# Patient Record
Sex: Female | Born: 1955 | Race: White | Hispanic: No | Marital: Married | Smoking: Never smoker
Health system: Southern US, Community
[De-identification: ages and names within clinical notes are randomized; demographics above are authoritative.]

## PROBLEM LIST (undated history)

## (undated) DIAGNOSIS — D649 Anemia, unspecified: Secondary | ICD-10-CM

## (undated) DIAGNOSIS — Z789 Other specified health status: Secondary | ICD-10-CM

## (undated) DIAGNOSIS — J189 Pneumonia, unspecified organism: Secondary | ICD-10-CM

## (undated) HISTORY — PX: ANKLE SURGERY: SHX546

## (undated) HISTORY — PX: COLONOSCOPY: SHX174

## (undated) HISTORY — PX: PILONIDAL CYST EXCISION: SHX744

## (undated) HISTORY — PX: DILATION AND CURETTAGE OF UTERUS: SHX78

## (undated) HISTORY — PX: CHOLECYSTECTOMY: SHX55

---

## 1998-08-14 ENCOUNTER — Encounter: Admission: RE | Admit: 1998-08-14 | Discharge: 1998-11-12 | Payer: Self-pay | Admitting: Internal Medicine

## 1999-07-29 ENCOUNTER — Encounter: Admission: RE | Admit: 1999-07-29 | Discharge: 1999-08-23 | Payer: Self-pay | Admitting: *Deleted

## 2002-07-12 ENCOUNTER — Encounter: Payer: Self-pay | Admitting: Emergency Medicine

## 2002-07-12 ENCOUNTER — Emergency Department (HOSPITAL_COMMUNITY): Admission: EM | Admit: 2002-07-12 | Discharge: 2002-07-12 | Payer: Self-pay | Admitting: Emergency Medicine

## 2002-07-15 ENCOUNTER — Encounter: Payer: Self-pay | Admitting: General Surgery

## 2002-07-16 ENCOUNTER — Encounter (INDEPENDENT_AMBULATORY_CARE_PROVIDER_SITE_OTHER): Payer: Self-pay | Admitting: *Deleted

## 2002-07-16 ENCOUNTER — Ambulatory Visit (HOSPITAL_COMMUNITY): Admission: RE | Admit: 2002-07-16 | Discharge: 2002-07-17 | Payer: Self-pay | Admitting: General Surgery

## 2002-07-16 ENCOUNTER — Encounter: Payer: Self-pay | Admitting: General Surgery

## 2003-10-28 ENCOUNTER — Ambulatory Visit (HOSPITAL_BASED_OUTPATIENT_CLINIC_OR_DEPARTMENT_OTHER): Admission: RE | Admit: 2003-10-28 | Discharge: 2003-10-28 | Payer: Self-pay | Admitting: Orthopaedic Surgery

## 2005-02-14 ENCOUNTER — Other Ambulatory Visit: Admission: RE | Admit: 2005-02-14 | Discharge: 2005-02-14 | Payer: Self-pay | Admitting: Family Medicine

## 2007-09-06 ENCOUNTER — Inpatient Hospital Stay (HOSPITAL_COMMUNITY): Admission: AD | Admit: 2007-09-06 | Discharge: 2007-09-06 | Payer: Self-pay | Admitting: Obstetrics and Gynecology

## 2007-09-12 ENCOUNTER — Encounter (INDEPENDENT_AMBULATORY_CARE_PROVIDER_SITE_OTHER): Payer: Self-pay | Admitting: Obstetrics and Gynecology

## 2007-09-12 ENCOUNTER — Ambulatory Visit (HOSPITAL_COMMUNITY): Admission: RE | Admit: 2007-09-12 | Discharge: 2007-09-12 | Payer: Self-pay | Admitting: Obstetrics and Gynecology

## 2011-03-08 NOTE — Op Note (Signed)
Tina Valdez, Tina Valdez                ACCOUNT NO.:  [id number]   MEDICAL RECORD NO.:  [id number]          PATIENT TYPE:  AMB   LOCATION:  SDC                           FACILITY:  [facility]   PHYSICIAN:  Charles A. Delcambre, MDDATE OF BIRTH:  [birth year]   DATE OF PROCEDURE:  09/12/2007  DATE OF DISCHARGE:                               OPERATIVE REPORT   PREOPERATIVE DIAGNOSIS:  Post menopausal bleeding, possible endometrial  polyp seen on endometrial biopsy.   POSTOPERATIVE DIAGNOSIS:  Post menopausal bleeding, no evidence of  polyp.   PROCEDURE:  1. Hysteroscopy.  2. Dilation and curettage.  3. Paracervical block.   SURGEON:  Charles A. Delcambre, MD   ASSISTANT:  None.   COMPLICATIONS:  None.   ESTIMATED BLOOD LOSS:  Less than or equal to 25 mL.   COMPLICATIONS:  None.   SORBITOL LOSS:  50 mL.   OPERATIVE FINDINGS:  Shaggy appearing endometrium inappropriate for post  menopausal status but possibly related to progesterone use in an effort  to stop her bleeding.   SPECIMENS:  Endometrial curettings to pathology.   DESCRIPTION OF PROCEDURE:  The patient was taken to the operating room  and placed in the supine position.  General anesthetic was induced  without difficulty.  She was then placed in the dorsal lithotomy  position in universal stirrups.  A sterile prep and drape was  undertaken.  A speculum was placed in the vagina.  The anterior lip of  the cervix was grasped with a single tooth tenaculum.  A paracervical  block with 10 mL each at 4 and 8 o'clock with 0.25% plain Marcaine was  placed without any evidence of intravascular location of injection.  Hegar dilators could be used to dilate enough to pass the small  hysteroscope.  The hysteroscopic findings are noted above.  The  hysteroscope was removed.  The small banjo curette was inserted.  There  was no evidence of perforation.  Generalized curettings down to a gritty surface were done and a small  amount of  tissue was obtained. There was no evidence of complication.  The tenaculum was removed.  Hemostasis was excellent.  The patient was  awakened and taken to the recovery room with the physician in attendance  having tolerated the procedure well.      Charles A. Sydnee Cabal, MD  Electronically Signed     CAD/MEDQ  D:  09/12/2007  T:  09/12/2007  Job:  [id number]

## 2011-03-11 NOTE — Op Note (Signed)
NAMEJANILLE, DRAUGHON NO.:  [id number]   MEDICAL RECORD NO.:  [id number]                   PATIENT TYPE:  OIB   LOCATION:  5727                                 FACILITY:  [facility]   PHYSICIAN:  Adolph Pollack, M.D.            DATE OF BIRTH:  1956/08/07   DATE OF PROCEDURE:  07/16/2002  DATE OF DISCHARGE:                                 OPERATIVE REPORT   PREOPERATIVE DIAGNOSES:  Symptomatic cholelithiasis.   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   OPERATION PERFORMED:  Laparoscopic cholecystectomy with intraoperative  cholangiogram.   SURGEON:  Adolph Pollack, M.D.   ASSISTANT:  Gabrielle Dare. Janee Morn, M.D.   ANESTHESIA:  General.   INDICATIONS FOR PROCEDURE:  The patient is a 55 year old female who went to  the emergency department approximately one week ago with a severe case of  biliary colic.  Liver function tests, amylase and lipase were normal.  White  blood cell count was slightly elevated at 11,500 and ultrasound demonstrated  a 1.7 cm mobile gallstone near the neck of the gallbladder.  She was seen in  the urgent office where she was feeling some better.  She was scheduled for  cholecystectomy today.  The procedure and the risks were explained to her  preoperatively.   DESCRIPTION OF PROCEDURE:  She was placed supine on the operating table and  a general anesthetic was administered.  Her abdomen was sterilely prepped  and draped.  Local anesthetic was infiltrated in the subumbilical region and  a subumbilical incision was made incising the skin and subcutaneous tissues  sharply.  The fascia was identified and a 1.5 cm incision made in the  midline fascia.  A pursestring suture of 0 Vicryl was placed around the  fascial edges.  The peritoneal cavity was then entered bluntly and under  direct vision.  A Hasson trocar was introduced into the peritoneal cavity  and pneumoperitoneum  created by installation of CO2 gas.  A laparoscope was  then introduced and the patient placed in the appropriate position.  Under  direct vision, an 11 mm trocar was placed through an epigastric incision  with two 5 mm trocars placed through right upper quadrant incisions.  The  liver appeared to be fairly normal.  The gallbladder had adhesions from the  omentum to it and appeared to be chronically inflamed.  The fundus of the  gallbladder was grasped and retracted toward the right shoulder.  The  infundibulum was then grasped and retracted laterally.  Using blunt  dissection and electrocautery the infundibulum was completely mobilized.  I  then identified an anterior branch of the cystic artery, clipped it and  divided it.  The cystic duct was then isolated and a window created around  it.  A clip was placed in the cystic duct gallbladder junction and a small  incision made in the cystic  duct.  A cholangiocatheter was passed through  the anterior abdominal wall and placed into the cystic duct and a  cholangiogram was performed.   Under real time fluoroscopy, dilute contrast was injected into the cystic  duct.  It was a moderately long cystic duct.  The common bile duct and  common hepatic ducts filled rapidly and the common bile duct promptly  emptied contrast into the duodenum without obvious evidence of obstruction.  The right and left hepatic ducts were seen.  Final reports pending a  radiologist's interpretation.  The cholangiocatheter was removed and the  cystic duct clipped three times proximally and divided.  A posterior branch  of the cystic artery was identified clipped and divided.  We then dissected  the gallbladder free from the liver bed using the cautery.  All dissection  was done very close to the gallbladder.  Once the gallbladder was removed it  was placed in an Endopouch bag.  The gallbladder fossa was irrigated and  bleeding points were controlled with the cautery.  It  was inspected a couple  of more times.  No bleeding or bile leakage was noted.  It was then  irrigated and the fluid was evacuated.  The gallbladder was then removed in  the Endopouch bag through the subumbilical port.  The subumbilical fascial  defect was then closed by tightening up and tying the pursestring suture  under laparoscopic vision.  Skin incisions were closed with 4-0 Monocryl  subcuticular sutures after all trocars were removed and the pneumoperitoneum  was released.  Steri-Strips and sterile dressings were then applied to the  incisions.   The patient tolerated the procedure well without any apparent complications  and was taken to the recovery room in satisfactory condition.                                                 Adolph Pollack, M.D.    Kari Baars  D:  07/16/2002  T:  07/17/2002  Job:  [id number]   cc:   [employer].

## 2011-03-11 NOTE — Op Note (Signed)
NAMEDOMONIC, HISCOX                            ACCOUNT NO.:  [id number]   MEDICAL RECORD NO.:  [id number]                   PATIENT TYPE:  AMB   LOCATION:  DSC                                  FACILITY:  [facility]   PHYSICIAN:  Lubertha Basque. Jerl Santos, M.D.             DATE OF BIRTH:  1956-03-26   DATE OF PROCEDURE:  10/28/2003  DATE OF DISCHARGE:                                 OPERATIVE REPORT   PREOPERATIVE DIAGNOSES:  1. Left foot Haglund's deformity.  2. Left tendo-Achilles spur.   POSTOPERATIVE DIAGNOSES:  1. Left foot Haglund's deformity.  2. Left tendo-Achilles spur.   OPERATION PERFORMED:  1. Left foot pump bump excision.  2. Left foot tendo-Achilles debridement.   SURGEON:  Lubertha Basque. Jerl Santos, M.D.   ASSISTANT:  Prince Rome, P.A.   ANESTHESIA:  General.   INDICATIONS FOR PROCEDURE:  The patient is a 55 year old woman with years of  foot pain worse on the left.  This has persisted despite oral anti-  inflammatories, orthotics, and a heel lift.  She continues with difficulty  about the back of her heel, both in the area of her retrocalcaneal bursa and  at the Achilles attachment site.  She has a prominent Haglund's deformity on  x-ray and a small insertional spur of the Achilles.  She was offered  excision of the pump bump and debridement of her Achilles with removal of  the spur.  Informed operative consent was obtained after discussion of  possible complications of reaction to anesthesia, infection, and Achilles  rupture.   DESCRIPTION OF PROCEDURE:  The patient was taken to the operating suite  where general anesthetic was applied without difficulty.  The patient was  positioned prone with chest rolls.  All bony prominences were appropriately  padded.  She was prepped and draped in the normal sterile fashion.  After  administration of preop intravenous antibiotics, the left leg was elevated,  exsanguinated and tourniquet inflated about the calf.  A small  posteromedial  longitudinal incision was made with dissection down to the retrocalcaneal  bursa.  The Achilles was retracted out of harm's way and fluoroscopy was  used to confirm location of the pump bump.  This was removed with an  oscillating saw under fluoroscopic guidance.  I read these views myself.  I  also excised the retrocalcaneal bursa.  She did have a small spur in her  Achilles tendon.  A longitudinal split was made in the structure and the  spur was excised.  I debrided the peritenon which did appear to be fairly  inflamed.  The longitudinal split in her tendon was repaired with 2-0 Vicryl  in interrupted fashion.  The wounds were thoroughly irrigated followed by  release of the tourniquet.  A small amount of bleeding was easily controlled  with Bovie cautery.  Subcutaneous tissues were reapproximated with 2-0  undyed Vicryl and skin was closed with  nylon.  Marcaine was injected about  the incision site in the subcutaneous tissues followed by Adaptic and a dry  gauze dressing with a posterior splint of plaster with the ankle in neutral  position.  Estimated blood loss and intraoperative fluids as well as  accurate tourniquet time can be obtained from anesthesia records.   DISPOSITION:  The patient was extubated in the operating room and taken to  the recovery room in stable condition.  Plans were for the patient to go  home the same day and to follow up in the office in less than a week.  I  will contact her by phone tonight.                                               Lubertha Basque Jerl Santos, M.D.    PGD/MEDQ  D:  10/28/2003  T:  10/28/2003  Job:  [id number]

## 2011-08-02 LAB — CBC
MCV: 86.1
Platelets: 467 — ABNORMAL HIGH
RDW: 14.5
WBC: 12 — ABNORMAL HIGH

## 2011-08-02 LAB — SAMPLE TO BLOOD BANK

## 2015-08-07 ENCOUNTER — Other Ambulatory Visit: Payer: Self-pay | Admitting: Family Medicine

## 2015-08-07 DIAGNOSIS — N939 Abnormal uterine and vaginal bleeding, unspecified: Secondary | ICD-10-CM

## 2015-08-10 ENCOUNTER — Ambulatory Visit
Admission: RE | Admit: 2015-08-10 | Discharge: 2015-08-10 | Disposition: A | Discharge status: Home / Self Care | Payer: No Typology Code available for payment source | Source: Ambulatory Visit | Attending: Family Medicine | Admitting: Family Medicine

## 2015-08-10 DIAGNOSIS — N939 Abnormal uterine and vaginal bleeding, unspecified: Secondary | ICD-10-CM

## 2015-08-18 ENCOUNTER — Other Ambulatory Visit: Payer: Self-pay | Admitting: Obstetrics & Gynecology

## 2015-09-02 ENCOUNTER — Inpatient Hospital Stay (HOSPITAL_COMMUNITY)
Admission: AD | Admit: 2015-09-02 | Discharge: 2015-09-02 | Disposition: A | Discharge status: Home / Self Care | Payer: BLUE CROSS/BLUE SHIELD | Source: Ambulatory Visit | Attending: Obstetrics & Gynecology | Admitting: Obstetrics & Gynecology

## 2015-09-02 ENCOUNTER — Encounter (HOSPITAL_COMMUNITY): Payer: Self-pay | Admitting: *Deleted

## 2015-09-02 ENCOUNTER — Inpatient Hospital Stay (HOSPITAL_COMMUNITY): Payer: BLUE CROSS/BLUE SHIELD

## 2015-09-02 DIAGNOSIS — N939 Abnormal uterine and vaginal bleeding, unspecified: Secondary | ICD-10-CM | POA: Insufficient documentation

## 2015-09-02 HISTORY — DX: Other specified health status: Z78.9

## 2015-09-02 LAB — URINALYSIS, ROUTINE W REFLEX MICROSCOPIC
BILIRUBIN URINE: NEGATIVE
GLUCOSE, UA: 250 mg/dL — AB
KETONES UR: 15 mg/dL — AB
Nitrite: POSITIVE — AB
Specific Gravity, Urine: <1.005 — ABNORMAL LOW (ref 1.005–1.030)
Urobilinogen, UA: 2 mg/dL — ABNORMAL HIGH (ref 0.0–1.0)
pH: 7 (ref 5.0–8.0)

## 2015-09-02 LAB — URINE MICROSCOPIC-ADD ON

## 2015-09-02 LAB — CBC
HEMATOCRIT: 42.4 % (ref 36.0–46.0)
Hemoglobin: 14.1 g/dL (ref 12.0–15.0)
MCH: 28.9 pg (ref 26.0–34.0)
MCHC: 33.3 g/dL (ref 30.0–36.0)
MCV: 86.9 fL (ref 78.0–100.0)
PLATELETS: 398 10*3/uL (ref 150–400)
RBC: 4.88 MIL/uL (ref 3.87–5.11)
RDW: 14.9 % (ref 11.5–15.5)
WBC: 7.6 10*3/uL (ref 4.0–10.5)

## 2015-09-02 NOTE — Discharge Instructions (Signed)

## 2015-09-02 NOTE — MAU Provider Note (Signed)
Chief Complaint: Vaginal Bleeding and Abdominal Pain   First Provider Initiated Contact with Patient 09/02/15 1149      SUBJECTIVE HPI: Tina Valdez is a 59 y.o. G2P1101 who presents to maternity admissions reporting onset of heavy vaginal bleeding 5 days ago with history of AUB controlled on Provera 10 gm daily.  She reports bleeding had stopped, and she was not requiring pads, then on 11/4 she had episode of bleeding causing a large puddle underneath her in bed, followed by 5 days of heavy menstrual bleeding with large clots, requiring frequent pad changes.  She reports some mild dizziness occasionally since onset of bleeding.  She is taking Provera 10 mg daily as prescribed. She denies vaginal itching/burning, urinary symptoms, h/a, dizziness, n/v, or fever/chills.     Vaginal Bleeding The patient's primary symptoms include vaginal bleeding. The patient's pertinent negatives include no pelvic pain or vaginal discharge. This is a recurrent problem. The current episode started today. The problem occurs constantly. The problem has been unchanged. The patient is experiencing no pain. Pertinent negatives include no abdominal pain, back pain, chills, constipation, diarrhea, dysuria, fever, flank pain, frequency, headaches, nausea or vomiting. The vaginal bleeding is heavier than menses. She has been passing clots. She has not been passing tissue. Nothing aggravates the symptoms. She has tried NSAIDs for the symptoms. The treatment provided no relief. Her past medical history is significant for menorrhagia and metrorrhagia.    Past Medical History  Diagnosis Date  . Medical history non-contributory    Past Surgical History  Procedure Laterality Date  . Cesarean section    . Pilonidal cyst excision    . Dilation and curettage of uterus    . Cholecystectomy    . Ankle surgery    . Carpal tunnel release      Left wrist   Social History   Social History  . Marital Status: Married    Spouse  Name: N/A  . Number of Children: N/A  . Years of Education: N/A   Occupational History  . Not on file.   Social History Main Topics  . Smoking status: Never Smoker   . Smokeless tobacco: Not on file  . Alcohol Use: No  . Drug Use: No  . Sexual Activity: Yes    Birth Control/ Protection: Post-menopausal   Other Topics Concern  . Not on file   Social History Narrative  . No narrative on file   No current facility-administered medications on file prior to encounter.   No current outpatient prescriptions on file prior to encounter.   Allergies  Allergen Reactions  . Bee Venom Swelling    ROS:  Review of Systems  Constitutional: Negative for fever, chills and fatigue.  HENT: Negative for sinus pressure.   Eyes: Negative for photophobia.  Respiratory: Negative for shortness of breath.   Cardiovascular: Negative for chest pain.  Gastrointestinal: Negative for nausea, vomiting, abdominal pain, diarrhea and constipation.  Genitourinary: Positive for vaginal bleeding and menorrhagia. Negative for dysuria, frequency, flank pain, vaginal discharge, difficulty urinating, vaginal pain and pelvic pain.  Musculoskeletal: Negative for back pain and neck pain.  Neurological: Negative for dizziness, weakness and headaches.  Psychiatric/Behavioral: Negative.      I have reviewed patient's Past Medical Hx, Surgical Hx, Family Hx, Social Hx, medications and allergies.   Physical Exam   Patient Vitals for the past 24 hrs:  BP Temp Temp src Pulse Resp SpO2  09/02/15 1422 152/83 mmHg 98.5 F (36.9 C) Oral 73 20 -  09/02/15 1121 134/57 mmHg 98.3 F (36.8 C) Oral 75 20 98 %   Constitutional: Well-developed, well-nourished female in no acute distress.  Cardiovascular: normal rate Respiratory: normal effort GI: Abd soft, non-tender. Pos BS x 4 MS: Extremities nontender, no edema, normal ROM Neurologic: Alert and oriented x 4.  GU: Neg CVAT.  PELVIC EXAM: Cervix pink, visually  closed, without lesion, small to moderate amount of blood in vault, no active bleeding from os during exam, vaginal walls and external genitalia normal Bimanual exam: Cervix 0/long/high, firm, anterior, neg CMT, uterus nontender, nonenlarged, adnexa without tenderness, enlargement, or mass, exam technically difficult due to body habitus   LAB RESULTS Results for orders placed or performed during the hospital encounter of 09/02/15 (from the past 24 hour(s))  Urinalysis, Routine w reflex microscopic (not at [facility])     Status: Abnormal   Collection Time: 09/02/15 11:00 AM  Result Value Ref Range   Color, Urine RED (A) YELLOW   APPearance CLOUDY (A) CLEAR   Specific Gravity, Urine <1.005 (L) 1.005 - 1.030   pH 7.0 5.0 - 8.0   Glucose, UA 250 (A) NEGATIVE mg/dL   Hgb urine dipstick LARGE (A) NEGATIVE   Bilirubin Urine NEGATIVE NEGATIVE   Ketones, ur 15 (A) NEGATIVE mg/dL   Protein, ur >469 (A) NEGATIVE mg/dL   Urobilinogen, UA 2.0 (H) 0.0 - 1.0 mg/dL   Nitrite POSITIVE (A) NEGATIVE   Leukocytes, UA MODERATE (A) NEGATIVE  Urine microscopic-add on     Status: Abnormal   Collection Time: 09/02/15 11:00 AM  Result Value Ref Range   Squamous Epithelial / LPF RARE RARE   WBC, UA 0-2 <3 WBC/hpf   RBC / HPF TOO NUMEROUS TO COUNT <3 RBC/hpf   Bacteria, UA FEW (A) RARE  CBC     Status: None   Collection Time: 09/02/15 11:30 AM  Result Value Ref Range   WBC 7.6 4.0 - 10.5 K/uL   RBC 4.88 3.87 - 5.11 MIL/uL   Hemoglobin 14.1 12.0 - 15.0 g/dL   HCT 62.9 52.8 - 41.3 %   MCV 86.9 78.0 - 100.0 fL   MCH 28.9 26.0 - 34.0 pg   MCHC 33.3 30.0 - 36.0 g/dL   RDW 24.4 01.0 - 27.2 %   Platelets 398 150 - 400 K/uL       IMAGING US Transvaginal Non-ob  08/10/2015  CLINICAL DATA:  Six week history of dysfunctional uterine bleeding. EXAM: TRANSABDOMINAL AND TRANSVAGINAL ULTRASOUND OF PELVIS TECHNIQUE: Both transabdominal and transvaginal ultrasound examinations of the pelvis were performed.  Transabdominal technique was performed for global imaging of the pelvis including uterus, ovaries, adnexal regions, and pelvic cul-de-sac. It was necessary to proceed with endovaginal exam following the transabdominal exam to visualize the ovaries and endometrium. COMPARISON:  09/06/2007 FINDINGS: Uterus Measurements: 9.6 x 6.0 x 4.5 cm. Heterogeneous appearance of the myometrium with 2 small cystic areas. Findings could be due to adenomyosis. Suspected 2.8 x 2.1 x 2.5 cm submucosal fibroid involving the anterior myometrium. Endometrium Thickness: 4.5 mm.  No focal abnormality visualized. Right ovary Not visualized.  No adnexal mass. Left ovary Not visualized.  No adnexal mass. Other findings No free fluid. IMPRESSION: 1. Heterogeneous appearance of the myometrium with 2 small cystic areas noted. Findings could be due to adenomyosis. Pelvic MR may be helpful for further evaluation. 2. Suspect a 2.8 x 2.1 x 2.5 cm submucosal fibroid involving the anterior myometrium. 3. Non visualization of both ovaries.  No adnexal masses. Electronically Signed  By: Rudie Meyer M.D.   On: 08/10/2015 14:28   US Pelvis Complete  08/10/2015  CLINICAL DATA:  Six week history of dysfunctional uterine bleeding. EXAM: TRANSABDOMINAL AND TRANSVAGINAL ULTRASOUND OF PELVIS TECHNIQUE: Both transabdominal and transvaginal ultrasound examinations of the pelvis were performed. Transabdominal technique was performed for global imaging of the pelvis including uterus, ovaries, adnexal regions, and pelvic cul-de-sac. It was necessary to proceed with endovaginal exam following the transabdominal exam to visualize the ovaries and endometrium. COMPARISON:  09/06/2007 FINDINGS: Uterus Measurements: 9.6 x 6.0 x 4.5 cm. Heterogeneous appearance of the myometrium with 2 small cystic areas. Findings could be due to adenomyosis. Suspected 2.8 x 2.1 x 2.5 cm submucosal fibroid involving the anterior myometrium. Endometrium Thickness: 4.5 mm.  No focal  abnormality visualized. Right ovary Not visualized.  No adnexal mass. Left ovary Not visualized.  No adnexal mass. Other findings No free fluid. IMPRESSION: 1. Heterogeneous appearance of the myometrium with 2 small cystic areas noted. Findings could be due to adenomyosis. Pelvic MR may be helpful for further evaluation. 2. Suspect a 2.8 x 2.1 x 2.5 cm submucosal fibroid involving the anterior myometrium. 3. Non visualization of both ovaries.  No adnexal masses. Electronically Signed   By: Rudie Meyer M.D.   On: 08/10/2015 14:28    MAU Management/MDM: Ordered labs and reviewed results.  Consult Dr Richardson Dopp to review assessment and labs.  Hemoglobin stable and minimal to moderate bleeding on pelvic exam. Pt denies dysuria, frequency, or suprapubic pain.  Pt to continue Provera daily, follow up outpatient next week.  Pt stable at time of discharge.  ASSESSMENT 1. Abnormal uterine bleeding (AUB)   2. Abnormal uterine bleeding (AUB)     PLAN Discharge home Urine sent for culture Provera 10 mg daily     Medication List    TAKE these medications        ibuprofen 800 MG tablet  Commonly known as:  ADVIL,MOTRIN  Take 800 mg by mouth 3 (three) times daily.     medroxyPROGESTERone 10 MG tablet  Commonly known as:  PROVERA  Take 10 mg by mouth daily.       Follow-up Information    Follow up with Myna Hidalgo, M, DO In 1 week.   Specialty:  Obstetrics and Gynecology   Why:  Return to MAU as needed for emergencies   Contact information:   [address] [place] [address] [phone]       Sharen Counter Certified Nurse-Midwife 09/02/2015  7:37 PM

## 2015-09-02 NOTE — MAU Note (Signed)
Pt states she woke up on 08-28-15 in a "puddle of blood".  Has been bleeding heavy since.  Last two days she has been changing her pad every 30 minutes and passing lots of clots.  Has seen Dr. Charlotta Newtonzan for this same issue and is taking progest. Pills.  Pt states she is very nauseated.

## 2015-10-09 ENCOUNTER — Encounter (HOSPITAL_COMMUNITY): Payer: Self-pay

## 2015-10-09 ENCOUNTER — Encounter (HOSPITAL_COMMUNITY)
Admission: RE | Admit: 2015-10-09 | Discharge: 2015-10-09 | Disposition: A | Discharge status: Home / Self Care | Payer: BLUE CROSS/BLUE SHIELD | Source: Ambulatory Visit | Attending: Obstetrics & Gynecology | Admitting: Obstetrics & Gynecology

## 2015-10-09 DIAGNOSIS — N950 Postmenopausal bleeding: Secondary | ICD-10-CM | POA: Insufficient documentation

## 2015-10-09 DIAGNOSIS — Z01812 Encounter for preprocedural laboratory examination: Secondary | ICD-10-CM | POA: Insufficient documentation

## 2015-10-09 LAB — COMPREHENSIVE METABOLIC PANEL
ALBUMIN: 4.1 g/dL (ref 3.5–5.0)
ALK PHOS: 57 U/L (ref 38–126)
ALT: 25 U/L (ref 14–54)
ANION GAP: 9 (ref 5–15)
AST: 22 U/L (ref 15–41)
BUN: 13 mg/dL (ref 6–20)
CALCIUM: 9.5 mg/dL (ref 8.9–10.3)
CO2: 25 mmol/L (ref 22–32)
Chloride: 107 mmol/L (ref 101–111)
Creatinine, Ser: 0.83 mg/dL (ref 0.44–1.00)
GFR calc Af Amer: >60 mL/min (ref >60)
GFR calc non Af Amer: >60 mL/min (ref >60)
GLUCOSE: 108 mg/dL — AB (ref 65–99)
Potassium: 4.7 mmol/L (ref 3.5–5.1)
SODIUM: 141 mmol/L (ref 135–145)
Total Bilirubin: 0.6 mg/dL (ref 0.3–1.2)
Total Protein: 8.5 g/dL — ABNORMAL HIGH (ref 6.5–8.1)

## 2015-10-09 LAB — CBC
HCT: 41.8 % (ref 36.0–46.0)
HEMOGLOBIN: 13.7 g/dL (ref 12.0–15.0)
MCH: 28.5 pg (ref 26.0–34.0)
MCHC: 32.8 g/dL (ref 30.0–36.0)
MCV: 87.1 fL (ref 78.0–100.0)
Platelets: 419 10*3/uL — ABNORMAL HIGH (ref 150–400)
RBC: 4.8 MIL/uL (ref 3.87–5.11)
RDW: 15.4 % (ref 11.5–15.5)
WBC: 8.5 10*3/uL (ref 4.0–10.5)

## 2015-10-09 NOTE — Patient Instructions (Signed)
   Your procedure is scheduled on: December 28 [facility](WEDNESDAY)  [facility]: 930[facility]  Pick up the phone at the desk and dial [phone] and inform us of your arrival.  Please call this number if you have any problems the morning of surgery: [phone]  DO NOT EAT OR DRINK AFTER MIDNIGHT December 27 (TUESDAY)  Take these medicines the morning of surgery with a SIP OF WATER:  Do not wear jewelry, make-up, or FINGER nail polish No metal in your hair or on your body. Do not wear lotions, powders, perfumes.  You may wear deodorant.  Do not bring valuables to the hospital. Contacts, dentures or bridgework may not be worn into surgery.  Leave suitcase in the car. After Surgery it may be brought to your room. For patients being admitted to the hospital, checkout time is 11:00am the day of discharge.

## 2015-10-16 NOTE — H&P (Signed)
59yo postmenopaual female who presents for laparoscopic hysterectomy, bilateral salpingectomy, possible open. In review, the patient has been followed for signficant postmenopausal bleeding. Pt reports that starting about two months ago she noted bright red bleeding. The bleeding is irregular and unpredictable. Initially it started for a day or two and then it got progressively worse. With increased frequency, amount and passage of grape-sized clots. At it's worst, the bleeding will cause her to change a pad every hour. With significant bleeding she notes pelvic cramping. She has been on progesterone and progesterone to manage her bleeding though it has not completely resolved. At this point, she wishes to proceed with surgical management. US: 9.6x6x4.5cm uterus, 4.685mm endometrial stripe. 2cm submucosal fibroid. Heterogenous appearing myometrium with two areas of cystic changes. Ovaries not visualized. EMB: Highly fragmented benign endometrial tissue.   Current Medications  Taking   Turmeric 500 MG Capsule   Ibuprofen   Provera(Medroxyprogesterone) 10 MG Tablet 1 tablet Once a day   Medication List reviewed and reconciled with the patient    Past Medical History  Osteoarthritis  Mood disorder Obesity   Surgical History  cholecystectomy 2001  left ankle surgery for bone spur 2004  left carpel tunnel repair per Dr. Lilla ShookMeyedierks 06/04/2008  D&C 1985   Family History  Father: unknown  Mother: unknown  Paternal Grand Father: unknown  Paternal Grand Mother: unknown  Maternal Grand Father: unknown  Maternal Grand Mother: unknown  Brother 1: alive  Sister 1: alive  pt is adopted.   Social History  General:  Tobacco use  cigarettes: Former smoker Quit in year 2003 Tobacco history last updated 08/18/2015 no EXPOSURE TO PASSIVE SMOKE.  no Alcohol.  Caffeine: yes, 1-2 servings daily of coffee or soda.  no Recreational drug use.  DIET: drinks water.  no Exercise, walks 2-3 times a week.   Marital Status: Married.  Children: 1, girl and one deceased child.  EDUCATION: High School.  OCCUPATION: unemployed.  Seat belt use: yes.    Gyn History  Sexual activity currently sexually active.  Periods : postmenopausal.  LMP 15 years ago.  Last pap smear date 08/03/2015 Normal.  Last mammogram date 02/01/2015.  menopause early 4740's.    OB History  Pregnancy # 1 live birth, vaginal delivery @ 30 week.  Pregnancy # 2 live birth, C-section delivery, 40 weeks.    Allergies  Bee stings: anaphylaxis   Hospitalization/Major Diagnostic Procedure  cholescystectomy 2001  childbirth x 2 1982/85   Review of Systems  CONSTITUTIONAL:  no Chills. no Fever. no Night sweats.  HEENT:  Blurrred vision no. no Double vision.  CARDIOLOGY:  no Chest pain.  RESPIRATORY:  no Shortness of breath. no Cough.  UROLOGY:  no Urinary frequency. no Urinary incontinence. no Urinary urgency.  GASTROENTEROLOGY:  no Abdominal pain. no Appetite change. no Change in bowel movements.  FEMALE REPRODUCTIVE:  no Breast lumps or discharge. no Breast pain. no Vaginal irritation. no Vaginal itching. no Vaginal spotting.  NEUROLOGY:  no Dizziness. no Headache. no Loss of consciousness.  SKIN:  no Rash. no Hives.  HEMATOLOGY/LYMPH:  no Anemia. no Fatigue. Using Blood Thinners no.     O: Examination performed in office on 12/16 General Examination: GENERAL APPEARANCE well developed, well nourished.  SKIN: warm and dry, no rashes.  NECK: supple, normal appearance.  LUNGS: regular breathing rate and effort.  HEART: no murmurs, regular rate and rhythm- no tachycardia noted.  ABDOMEN: morbidly obese, soft, no rebound, no guarding, unable to palpate for masses.  MUSCULOSKELETAL no calf tenderness bilaterally.  EXTREMITIES: no edema present.  PSYCH appropriate mood and affect.   A/P: 59yo postmenopausal female who present for total laparoscopic hysterectomy, bilateral salpingectomy, possible open  hysterectomy -NPO -LR @ 125cc/hr -Ancef 3g IV -SCDs to OR -Risk/benefits/indications and alternatives were reviewed with patient- including risk of bleeding, infection and injury to surrounding organs.  Questions and concerns were addressed and pt wishes to proceed with procedure.  Myna Hidalgo, DO [phone] (pager) [phone] (office)

## 2015-10-20 MED ORDER — DEXTROSE 5 % IV SOLN
3.0000 g | INTRAVENOUS | Status: AC
  Administered 2015-10-21: 3 g via INTRAVENOUS
  Filled 2015-10-20: qty 3000

## 2015-10-21 ENCOUNTER — Encounter (HOSPITAL_COMMUNITY): Payer: Self-pay | Admitting: Anesthesiology

## 2015-10-21 ENCOUNTER — Ambulatory Visit (HOSPITAL_COMMUNITY): Payer: BLUE CROSS/BLUE SHIELD | Admitting: Anesthesiology

## 2015-10-21 ENCOUNTER — Encounter (HOSPITAL_COMMUNITY)
Admission: RE | Disposition: A | Discharge status: Home / Self Care | Payer: Self-pay | Source: Ambulatory Visit | Attending: Obstetrics & Gynecology

## 2015-10-21 ENCOUNTER — Observation Stay (HOSPITAL_COMMUNITY)
Admission: RE | Admit: 2015-10-21 | Discharge: 2015-10-22 | Disposition: A | Discharge status: Home / Self Care | Payer: BLUE CROSS/BLUE SHIELD | Source: Ambulatory Visit | Attending: Obstetrics & Gynecology | Admitting: Obstetrics & Gynecology

## 2015-10-21 DIAGNOSIS — Z87891 Personal history of nicotine dependence: Secondary | ICD-10-CM | POA: Insufficient documentation

## 2015-10-21 DIAGNOSIS — Z23 Encounter for immunization: Secondary | ICD-10-CM | POA: Insufficient documentation

## 2015-10-21 DIAGNOSIS — N950 Postmenopausal bleeding: Principal | ICD-10-CM | POA: Diagnosis present

## 2015-10-21 DIAGNOSIS — Z6841 Body Mass Index (BMI) 40.0 and over, adult: Secondary | ICD-10-CM | POA: Insufficient documentation

## 2015-10-21 HISTORY — PX: BILATERAL SALPINGECTOMY: SHX5743

## 2015-10-21 HISTORY — PX: LAPAROSCOPIC VAGINAL HYSTERECTOMY WITH SALPINGECTOMY: SHX6680

## 2015-10-21 SURGERY — HYSTERECTOMY, VAGINAL, LAPAROSCOPY-ASSISTED, WITH SALPINGECTOMY
Anesthesia: General | Site: Vagina | Laterality: Bilateral

## 2015-10-21 MED ORDER — METOCLOPRAMIDE HCL 5 MG/ML IJ SOLN: 10.0000 mg | Freq: Once | INTRAMUSCULAR | Status: DC | PRN

## 2015-10-21 MED ORDER — HYDROMORPHONE HCL 1 MG/ML IJ SOLN
0.2500 mg | INTRAMUSCULAR | Status: DC | PRN
  Administered 2015-10-21 (×2): 0.5 mg via INTRAVENOUS

## 2015-10-21 MED ORDER — GLYCOPYRROLATE 0.2 MG/ML IJ SOLN
INTRAMUSCULAR | Status: AC
  Filled 2015-10-21: qty 1

## 2015-10-21 MED ORDER — PROPOFOL 10 MG/ML IV BOLUS
INTRAVENOUS | Status: DC | PRN
  Administered 2015-10-21: 200 mg via INTRAVENOUS

## 2015-10-21 MED ORDER — LACTATED RINGERS IV SOLN
INTRAVENOUS | Status: DC
  Administered 2015-10-21 (×4): via INTRAVENOUS

## 2015-10-21 MED ORDER — PROPOFOL 10 MG/ML IV BOLUS
INTRAVENOUS | Status: AC
  Filled 2015-10-21: qty 20

## 2015-10-21 MED ORDER — KETOROLAC TROMETHAMINE 30 MG/ML IJ SOLN: 30.0000 mg | Freq: Four times a day (QID) | INTRAMUSCULAR | Status: DC

## 2015-10-21 MED ORDER — MEPERIDINE HCL 25 MG/ML IJ SOLN: 6.2500 mg | INTRAMUSCULAR | Status: DC | PRN

## 2015-10-21 MED ORDER — NEOSTIGMINE METHYLSULFATE 10 MG/10ML IV SOLN
INTRAVENOUS | Status: AC
  Filled 2015-10-21: qty 1

## 2015-10-21 MED ORDER — DEXAMETHASONE SODIUM PHOSPHATE 4 MG/ML IJ SOLN
INTRAMUSCULAR | Status: DC | PRN
  Administered 2015-10-21: 4 mg via INTRAVENOUS

## 2015-10-21 MED ORDER — HYDROMORPHONE HCL 1 MG/ML IJ SOLN
INTRAMUSCULAR | Status: AC
  Administered 2015-10-21: 0.5 mg via INTRAVENOUS
  Filled 2015-10-21: qty 1

## 2015-10-21 MED ORDER — PHENYLEPHRINE HCL 10 MG/ML IJ SOLN
INTRAMUSCULAR | Status: DC | PRN
  Administered 2015-10-21: 40 ug via INTRAVENOUS
  Administered 2015-10-21: 80 ug via INTRAVENOUS
  Administered 2015-10-21 (×2): 40 ug via INTRAVENOUS

## 2015-10-21 MED ORDER — SCOPOLAMINE 1 MG/3DAYS TD PT72
MEDICATED_PATCH | TRANSDERMAL | Status: DC
Start: 2015-10-21 — End: 2015-10-22
  Administered 2015-10-21: 1.5 mg via TRANSDERMAL
  Filled 2015-10-21: qty 1

## 2015-10-21 MED ORDER — MIDAZOLAM HCL 2 MG/2ML IJ SOLN
INTRAMUSCULAR | Status: AC
Start: 2015-10-21 — End: 2015-10-21
  Filled 2015-10-21: qty 2

## 2015-10-21 MED ORDER — FENTANYL CITRATE (PF) 100 MCG/2ML IJ SOLN
INTRAMUSCULAR | Status: AC
  Filled 2015-10-21: qty 2

## 2015-10-21 MED ORDER — LIDOCAINE-EPINEPHRINE (PF) 1 %-1:200000 IJ SOLN
INTRAMUSCULAR | Status: AC
  Filled 2015-10-21: qty 30

## 2015-10-21 MED ORDER — HYDROCODONE-ACETAMINOPHEN 5-325 MG PO TABS
1.0000 | ORAL_TABLET | ORAL | Status: DC | PRN
  Administered 2015-10-22: 1 via ORAL
  Administered 2015-10-22 (×2): 2 via ORAL
  Filled 2015-10-21 (×2): qty 2
  Filled 2015-10-21: qty 1

## 2015-10-21 MED ORDER — HYDROMORPHONE HCL 1 MG/ML IJ SOLN
INTRAMUSCULAR | Status: AC
  Filled 2015-10-21: qty 1

## 2015-10-21 MED ORDER — ROCURONIUM BROMIDE 100 MG/10ML IV SOLN
INTRAVENOUS | Status: DC | PRN
  Administered 2015-10-21: 10 mg via INTRAVENOUS
  Administered 2015-10-21: 5 mg via INTRAVENOUS
  Administered 2015-10-21: 10 mg via INTRAVENOUS
  Administered 2015-10-21: 5 mg via INTRAVENOUS
  Administered 2015-10-21: 10 mg via INTRAVENOUS
  Administered 2015-10-21: 45 mg via INTRAVENOUS

## 2015-10-21 MED ORDER — SCOPOLAMINE 1 MG/3DAYS TD PT72
1.0000 | MEDICATED_PATCH | Freq: Once | TRANSDERMAL | Status: DC
  Administered 2015-10-21: 1.5 mg via TRANSDERMAL

## 2015-10-21 MED ORDER — FENTANYL CITRATE (PF) 250 MCG/5ML IJ SOLN
INTRAMUSCULAR | Status: DC | PRN
  Administered 2015-10-21: 50 ug via INTRAVENOUS
  Administered 2015-10-21 (×2): 100 ug via INTRAVENOUS
  Administered 2015-10-21: 50 ug via INTRAVENOUS

## 2015-10-21 MED ORDER — METOCLOPRAMIDE HCL 5 MG/ML IJ SOLN
INTRAMUSCULAR | Status: DC | PRN
  Administered 2015-10-21: 10 mg via INTRAVENOUS

## 2015-10-21 MED ORDER — SODIUM CHLORIDE 0.9 % IJ SOLN
INTRAMUSCULAR | Status: AC
  Filled 2015-10-21: qty 10

## 2015-10-21 MED ORDER — LIDOCAINE HCL (CARDIAC) 20 MG/ML IV SOLN
INTRAVENOUS | Status: AC
  Filled 2015-10-21: qty 5

## 2015-10-21 MED ORDER — ROCURONIUM BROMIDE 100 MG/10ML IV SOLN
INTRAVENOUS | Status: AC
  Filled 2015-10-21: qty 1

## 2015-10-21 MED ORDER — BUPIVACAINE HCL (PF) 0.25 % IJ SOLN
INTRAMUSCULAR | Status: AC
  Filled 2015-10-21: qty 30

## 2015-10-21 MED ORDER — LIDOCAINE HCL (CARDIAC) 20 MG/ML IV SOLN
INTRAVENOUS | Status: DC | PRN
  Administered 2015-10-21: 100 mg via INTRAVENOUS

## 2015-10-21 MED ORDER — LACTATED RINGERS IV SOLN
INTRAVENOUS | Status: DC
  Administered 2015-10-22: 01:00:00 via INTRAVENOUS

## 2015-10-21 MED ORDER — ONDANSETRON HCL 4 MG PO TABS: 4.0000 mg | ORAL_TABLET | Freq: Four times a day (QID) | ORAL | Status: DC | PRN

## 2015-10-21 MED ORDER — HYDROMORPHONE HCL 1 MG/ML IJ SOLN
INTRAMUSCULAR | Status: DC | PRN
  Administered 2015-10-21: 0.5 mg via INTRAVENOUS
  Administered 2015-10-21: 1 mg via INTRAVENOUS
  Administered 2015-10-21: 0.5 mg via INTRAVENOUS

## 2015-10-21 MED ORDER — PANTOPRAZOLE SODIUM 40 MG IV SOLR
40.0000 mg | Freq: Every day | INTRAVENOUS | Status: DC
  Administered 2015-10-21: 40 mg via INTRAVENOUS
  Filled 2015-10-21: qty 40

## 2015-10-21 MED ORDER — LIDOCAINE-EPINEPHRINE (PF) 1 %-1:200000 IJ SOLN
INTRAMUSCULAR | Status: DC | PRN
  Administered 2015-10-21: 20 mL

## 2015-10-21 MED ORDER — GLYCOPYRROLATE 0.2 MG/ML IJ SOLN
INTRAMUSCULAR | Status: DC | PRN
  Administered 2015-10-21: 0.1 mg via INTRAVENOUS
  Administered 2015-10-21: 1 mg via INTRAVENOUS

## 2015-10-21 MED ORDER — KETOROLAC TROMETHAMINE 30 MG/ML IJ SOLN
30.0000 mg | Freq: Four times a day (QID) | INTRAMUSCULAR | Status: DC
  Administered 2015-10-21 – 2015-10-22 (×3): 30 mg via INTRAVENOUS
  Filled 2015-10-21 (×3): qty 1

## 2015-10-21 MED ORDER — KETOROLAC TROMETHAMINE 0.5 % OP SOLN
1.0000 [drp] | Freq: Three times a day (TID) | OPHTHALMIC | Status: DC | PRN
  Administered 2015-10-21 – 2015-10-22 (×2): 1 [drp] via OPHTHALMIC
  Filled 2015-10-21: qty 3

## 2015-10-21 MED ORDER — DOCUSATE SODIUM 100 MG PO CAPS
100.0000 mg | ORAL_CAPSULE | Freq: Two times a day (BID) | ORAL | Status: DC
  Administered 2015-10-21 – 2015-10-22 (×2): 100 mg via ORAL
  Filled 2015-10-21 (×2): qty 1

## 2015-10-21 MED ORDER — INFLUENZA VAC SPLIT QUAD 0.5 ML IM SUSY
0.5000 mL | PREFILLED_SYRINGE | INTRAMUSCULAR | Status: AC
  Administered 2015-10-22: 0.5 mL via INTRAMUSCULAR
  Filled 2015-10-21: qty 0.5

## 2015-10-21 MED ORDER — METOCLOPRAMIDE HCL 5 MG/ML IJ SOLN
INTRAMUSCULAR | Status: AC
  Filled 2015-10-21: qty 2

## 2015-10-21 MED ORDER — BSS IO SOLN
15.0000 mL | Freq: Once | INTRAOCULAR | Status: AC
  Administered 2015-10-21: 15 mL
  Filled 2015-10-21: qty 15

## 2015-10-21 MED ORDER — SIMETHICONE 80 MG PO CHEW: 80.0000 mg | CHEWABLE_TABLET | Freq: Four times a day (QID) | ORAL | Status: DC | PRN

## 2015-10-21 MED ORDER — FENTANYL CITRATE (PF) 250 MCG/5ML IJ SOLN
INTRAMUSCULAR | Status: AC
  Filled 2015-10-21: qty 5

## 2015-10-21 MED ORDER — MIDAZOLAM HCL 2 MG/2ML IJ SOLN
INTRAMUSCULAR | Status: DC | PRN
  Administered 2015-10-21: 2 mg via INTRAVENOUS

## 2015-10-21 MED ORDER — BUPIVACAINE HCL (PF) 0.25 % IJ SOLN
INTRAMUSCULAR | Status: DC | PRN
  Administered 2015-10-21: 30 mL

## 2015-10-21 MED ORDER — DEXAMETHASONE SODIUM PHOSPHATE 10 MG/ML IJ SOLN
INTRAMUSCULAR | Status: AC
  Filled 2015-10-21: qty 1

## 2015-10-21 MED ORDER — MENTHOL 3 MG MT LOZG: 1.0000 | LOZENGE | OROMUCOSAL | Status: DC | PRN

## 2015-10-21 MED ORDER — ONDANSETRON HCL 4 MG/2ML IJ SOLN
INTRAMUSCULAR | Status: AC
  Filled 2015-10-21: qty 2

## 2015-10-21 MED ORDER — NEOSTIGMINE METHYLSULFATE 10 MG/10ML IV SOLN
INTRAVENOUS | Status: DC | PRN
  Administered 2015-10-21: 5 mg via INTRAVENOUS

## 2015-10-21 MED ORDER — ONDANSETRON HCL 4 MG/2ML IJ SOLN
INTRAMUSCULAR | Status: DC | PRN
  Administered 2015-10-21: 4 mg via INTRAVENOUS

## 2015-10-21 MED ORDER — ONDANSETRON HCL 4 MG/2ML IJ SOLN: 4.0000 mg | Freq: Four times a day (QID) | INTRAMUSCULAR | Status: DC | PRN

## 2015-10-21 MED ORDER — LACTATED RINGERS IV SOLN
INTRAVENOUS | Status: DC
  Administered 2015-10-21: 17:00:00 via INTRAVENOUS

## 2015-10-21 SURGICAL SUPPLY — 47 items
AGENT HMST MTR 8 SURGIFLO (HEMOSTASIS)
CABLE HIGH FREQUENCY MONO STRZ (ELECTRODE) ×4 IMPLANT
CLOTH BEACON ORANGE TIMEOUT ST (SAFETY) ×4 IMPLANT
DEFOGGER SCOPE WARMER CLEARIFY (MISCELLANEOUS) ×2 IMPLANT
DISSECTOR BLUNT TIP ENDO 5MM (MISCELLANEOUS) ×2 IMPLANT
DRSG COVADERM PLUS 2X2 (GAUZE/BANDAGES/DRESSINGS) ×8 IMPLANT
DRSG OPSITE POSTOP 3X4 (GAUZE/BANDAGES/DRESSINGS) IMPLANT
DURAPREP 26ML APPLICATOR (WOUND CARE) ×8 IMPLANT
ELECT LIGASURE LONG (ELECTRODE) ×2 IMPLANT
ELECT LIGASURE SHORT 9 REUSE (ELECTRODE) ×4 IMPLANT
FORCEPS CUTTING 33CM 5MM (CUTTING FORCEPS) IMPLANT
GAUZE PACKING 1 X5 YD ST (GAUZE/BANDAGES/DRESSINGS) IMPLANT
GAUZE PACKING IODOFORM 2 (PACKING) ×2 IMPLANT
GLOVE BIOGEL PI IND STRL 6.5 (GLOVE) ×6 IMPLANT
GLOVE BIOGEL PI IND STRL 7.0 (GLOVE) ×2 IMPLANT
GLOVE BIOGEL PI INDICATOR 6.5 (GLOVE) ×6
GLOVE BIOGEL PI INDICATOR 7.0 (GLOVE) ×4
GLOVE ECLIPSE 6.5 STRL STRAW (GLOVE) ×8 IMPLANT
HEMOSTAT ARISTA ABSORB 3G PWDR (MISCELLANEOUS) ×2 IMPLANT
LEGGING LITHOTOMY PAIR STRL (DRAPES) ×4 IMPLANT
LIQUID BAND (GAUZE/BANDAGES/DRESSINGS) ×4 IMPLANT
NEEDLE INSUFFLATION 120MM (ENDOMECHANICALS) IMPLANT
OCCLUDER COLPOPNEUMO (BALLOONS) ×2 IMPLANT
PACK LAVH (CUSTOM PROCEDURE TRAY) ×4 IMPLANT
PACK ROBOTIC GOWN (GOWN DISPOSABLE) ×4 IMPLANT
PAD OB MATERNITY 4.3X12.25 (PERSONAL CARE ITEMS) ×4 IMPLANT
PAD POSITIONING PINK XL (MISCELLANEOUS) ×4 IMPLANT
SCISSORS LAP 5X35 DISP (ENDOMECHANICALS) ×4 IMPLANT
SET IRRIG TUBING LAPAROSCOPIC (IRRIGATION / IRRIGATOR) ×2 IMPLANT
SHEARS HARMONIC ACE PLUS 36CM (ENDOMECHANICALS) ×2 IMPLANT
SLEEVE XCEL OPT CAN 5 100 (ENDOMECHANICALS) ×2 IMPLANT
SPOGE SURGIFLO 8M (HEMOSTASIS)
SPONGE LAP 18X18 X RAY DECT (DISPOSABLE) ×2 IMPLANT
SPONGE SURGIFLO 8M (HEMOSTASIS) IMPLANT
SUT MON AB 4-0 PS1 27 (SUTURE) IMPLANT
SUT VIC AB 0 CT1 18XCR BRD8 (SUTURE) ×2 IMPLANT
SUT VIC AB 0 CT1 36 (SUTURE) ×8 IMPLANT
SUT VIC AB 0 CT1 8-18 (SUTURE) ×8
SUT VIC AB 4-0 PS2 27 (SUTURE) ×2 IMPLANT
SUT VICRYL 0 TIES 12 18 (SUTURE) ×4 IMPLANT
TIP UTERINE 6.7X8CM BLUE DISP (MISCELLANEOUS) ×2 IMPLANT
TOWEL OR 17X24 6PK STRL BLUE (TOWEL DISPOSABLE) ×8 IMPLANT
TRAY FOLEY CATH SILVER 14FR (SET/KITS/TRAYS/PACK) ×4 IMPLANT
TROCAR 5M 150ML BLDLS (TROCAR) ×8 IMPLANT
TROCAR XCEL NON-BLD 5MMX100MML (ENDOMECHANICALS) ×2 IMPLANT
WARMER LAPAROSCOPE (MISCELLANEOUS) ×4 IMPLANT
WATER STERILE IRR 1000ML POUR (IV SOLUTION) ×4 IMPLANT

## 2015-10-21 NOTE — Anesthesia Postprocedure Evaluation (Signed)
Anesthesia Post Note  Patient: Tina Valdez  Procedure(s) Performed: Procedure(s) (LRB): LAPAROSCOPIC ASSISTED VAGINAL HYSTERECTOMY  (Bilateral) BILATERAL SALPINGECTOMY (Bilateral)  Patient location during evaluation: PACU Anesthesia Type: General Level of consciousness: awake and alert Pain management: pain level controlled Vital Signs Assessment: post-procedure vital signs reviewed and stable Respiratory status: spontaneous breathing, nonlabored ventilation, respiratory function stable and patient connected to nasal cannula oxygen Cardiovascular status: blood pressure returned to baseline and stable Postop Assessment: no signs of nausea or vomiting Anesthetic complications: no    Last Vitals:  Filed Vitals:   10/21/15 1600 10/21/15 1615  BP: 114/82 112/80  Pulse: 89 85  Temp:    Resp: 12 18    Last Pain:  Filed Vitals:   10/21/15 1620  PainSc: 5                  Jerimey Burridge JENNETTE

## 2015-10-21 NOTE — Interval H&P Note (Signed)
History and Physical Interval Note:  10/21/2015 10:36 AM  Charlie PitterKaren Dunnaway  has presented today for surgery, with the diagnosis of N95.0 Postmenopausal Bleeding  The various methods of treatment have been discussed with the patient and family. After consideration of risks, benefits and other options for treatment, the patient has consented to  Procedure(s) with comments: LAPAROSCOPIC ASSISTED VAGINAL HYSTERECTOMY WITH SALPINGECTOMY (Bilateral) - REQUEST to use Arista on this case as a surgical intervention .  The patient's history has been reviewed, patient examined, no change in status, stable for surgery.  I have reviewed the patient's chart and labs.  Questions were answered to the patient's satisfaction.     Myna HidalgoZAN, Daqwan Dougal, M

## 2015-10-21 NOTE — Op Note (Addendum)
Preoperative Diagnosis: Postmenopausal bleeding Postoperative Diagnosis: same Procedure: Laparoscopy Assisted Vaginal Hysterectomy, Bilateral salpingectomy, lysis of adhesions Surgeon: Dr. Myna HidalgoJennifer Berwyn Valdez  Assistant: Dr. Marylou FlesherBenita Valdez- replaced by Dr. Gerald Leitzara Cole Anesthetic: General  IVF: 3100cc EBL: 650cc  UOP:150cc  Specimens: 1) bilateral tubes with uterus  Findings: No free fluid or omental studding appreciated. Normal appearing liver and bowel.  Omental adhesions noted to anterior abdominal wall.  Normal ovaries bilaterally, right fallopian tube adherent to the ovary and side wall.  9cm boggy enlarged uterus.    Procedure: The patient was taken to the operating room where general anesthesia was found to be adequate. The patient's abdomen was prepped with ChloraPrep. The perineum and vagina were prepped with multiple layers of Betadine. The patient was sterilely draped. A Foley catheter was placed in the bladder.  A weight speculum was placed in the vagina.  The anterior lip of cervix was grasped with a tenaculum.  The uterus sounded to 9cm.  A stitch was placed at 12 o'clock and the RUMI uterine manipulator was inserted. Gloves were changed and attention was turned to the abdomen. The infraumbilical incision was incised with quarter percent Marcaine. An incision was made in the infraumbilical area and the Veress needle was inserted into the abdominal cavity without difficulty.  The saline drop test was noted to have a high opening pressure.  The device was removed. The laparoscopic trocar and the laparoscope were placed under direct visualization. Once intrapertioneal placement was confirmed, a pneumoperitoneum was established.  Two additional ports were placed in the right and left lower quadrants in the following manner. Each area was injected with quarter percent Marcaine. A small incision was made and a 5 mm trocar was inserted into the abdominal cavity under direct visualization. An  abdominal scan was performed with the findings noted above.  Due to the adhesions at the umbilical area, the camera was moved to the RLQ and the adhesions were taken down with laparoscopic scissors.  Adequate hemostasis was confirmed.    Attention was turned to the left adnexa The harmonic was used to dissect, ligate and removed the left fallopian tube. The left utero-ovarian ligament was ligated. The left round ligament was grasped, elevated, fulgurated and divided using the Harmonic. The anterior leaf of the broad ligament was dissected medially and inferiorly to allow dissection of the vesicouterine flap.   Attention was turned to the right adnexa. A small piece of isthmus section of fallopian tube was densely adherent to the ovary and was unable to be removed.  The fimbriated portion of the fallopian tube was removed using the harmonic.  The utero-ovarian ligament was ligated. The right round ligament was clamped, elevated, ligated, and divided with the Harmonic. The vesicouterine fold of peritoneum was incised anteriorly for full dissection of the vesicouterine flap. The uterine artery was skeletonized.  Due to difficulty with visualization, the case then proceeded to the vaginal portion.  The uterine manipulator was removed and a weighted speculum was placed in the posterior vagina. The cervix was injected with half percent Marcaine with epinephrine. The cervix was then circumferentially incised with the bovie and the bladder was dissected off the pubovesical cervical fascia. The posterior cul-de-sac was entered sharply.The same procedure was performed anteriorly.  A heany clamp was placed over the uterosacral ligaments bilaterally. These were transected and suture ligated with 0 vicryl. The cardinal ligaments were then clamped bilaterally and transected with the Ligasure. The uterine arteries and broad ligament was then serially ligated with the Ligasure.  Due to her large uterus and narrow pelvis,  the uterus had to be bivalved for removal from the operative field.  The uterus and bilateral fallopian tubes were removed from the operative field and sent to pathology.  Adequate hemostasis was seen. The vaginal cuff was closed in a running locked fashion using 0 vicryl. Vaginal packing was placed. Gown and gloves were changed and attention was turned to the abdomen.  The pneumoperitoneum was reestablished. The pelvis was irrigated and inspected. Hemostasis was observed. Arista was placed for hemostasis.  The lateral 5 mm trochars were removed under direct visualization. The pneumoperitoneum was allowed to escape. The subumbilical trocar was removed. Dermabond was placed over the three trocar sites. The patient tolerated her procedure well. She was awakened from her anesthetic without difficulty and then transported to the recovery room in stable condition.   Dr. Dion Body assisted during the first portion of the case and then Dr. Richardson Dopp took her place.  An assistant was needed due to the patient's co-morbidities and difficulty of the case.  Tina Hidalgo, DO [phone] (pager) [phone] (office)

## 2015-10-21 NOTE — Addendum Note (Signed)
Addendum  created 10/21/15 1846 by Karie SchwalbeMary Dillen Belmontes, MD   Modules edited: Clinical Notes, Orders, PRL Based Order Sets   Clinical Notes:  File: [id number]

## 2015-10-21 NOTE — Transfer of Care (Signed)
Immediate Anesthesia Transfer of Care Note  Patient: Tina PitterKaren Valdez  Procedure(s) Performed: Procedure(s): LAPAROSCOPIC ASSISTED VAGINAL HYSTERECTOMY  (Bilateral) BILATERAL SALPINGECTOMY (Bilateral)  Patient Location: PACU  Anesthesia Type:General  Level of Consciousness: awake, alert  and oriented  Airway & Oxygen Therapy: Patient Spontanous Breathing and Patient connected to nasal cannula oxygen  Post-op Assessment: Report given to RN and Post -op Vital signs reviewed and stable  Post vital signs: Reviewed and stable  Last Vitals:  Filed Vitals:   10/21/15 0916  BP: 142/79  Pulse: 80  Temp: 36.9 C  Resp: 20    Complications: No apparent anesthesia complications

## 2015-10-21 NOTE — Progress Notes (Signed)
Called to evaluate eye pain on the floor post LVH. Eye appeared red but no visible object in eye. No edema. This is her left eye. She denies vision difficulty. She describes a "rock scratchy feeling" in her L eye. Corneal abrasion orders have been put in and if she has continued issues tomorrow, please call back with any questions or concerns and we will have an optho consult tomorrow.

## 2015-10-21 NOTE — Anesthesia Preprocedure Evaluation (Addendum)
Anesthesia Evaluation  Patient identified by MRN, date of birth, ID band Patient awake    Reviewed: Allergy & Precautions, NPO status , Patient's Chart, lab work & pertinent test results  Airway Mallampati: I  TM Distance: >3 FB Neck ROM: Full    Dental no notable dental hx. (+) Teeth Intact   Pulmonary neg pulmonary ROS,    Pulmonary exam normal breath sounds clear to auscultation       Cardiovascular negative cardio ROS Normal cardiovascular exam Rhythm:Regular Rate:Normal     Neuro/Psych negative neurological ROS  negative psych ROS   GI/Hepatic negative GI ROS, Neg liver ROS,   Endo/Other  Morbid obesity  Renal/GU negative Renal ROS  negative genitourinary   Musculoskeletal negative musculoskeletal ROS (+)   Abdominal (+) + obese,   Peds  Hematology negative hematology ROS (+)   Anesthesia Other Findings   Reproductive/Obstetrics PMB                            Anesthesia Physical Anesthesia Plan  ASA: III  Anesthesia Plan: General   Post-op Pain Management:    Induction: Intravenous  Airway Management Planned: Oral ETT  Additional Equipment:   Intra-op Plan:   Post-operative Plan: Extubation in OR  Informed Consent: I have reviewed the patients History and Physical, chart, labs and discussed the procedure including the risks, benefits and alternatives for the proposed anesthesia with the patient or authorized representative who has indicated his/her understanding and acceptance.   Dental advisory given  Plan Discussed with: CRNA, Anesthesiologist and Surgeon  Anesthesia Plan Comments:         Anesthesia Quick Evaluation

## 2015-10-21 NOTE — Anesthesia Procedure Notes (Signed)
Procedure Name: Intubation Date/Time: 10/21/2015 11:37 AM Performed by: Graciela HusbandsFUSSELL, Aminat [place] O Pre-anesthesia Checklist: Patient being monitored, Suction available, Emergency Drugs available, Patient identified and Timeout performed Patient Re-evaluated:Patient Re-evaluated prior to inductionOxygen Delivery Method: Circle system utilized Preoxygenation: Pre-oxygenation with 100% oxygen Intubation Type: IV induction Ventilation: Mask ventilation without difficulty Laryngoscope Size: Mac and 3 Grade View: Grade I Tube type: Oral Tube size: 7.0 mm Number of attempts: 1 Airway Equipment and Method: Patient positioned with wedge pillow and Stylet Placement Confirmation: ETT inserted through vocal cords under direct vision,  positive ETCO2 and breath sounds checked- equal and bilateral Secured at: 21 cm Tube secured with: Tape Dental Injury: Teeth and Oropharynx as per pre-operative assessment

## 2015-10-22 ENCOUNTER — Encounter (HOSPITAL_COMMUNITY): Payer: Self-pay | Admitting: Obstetrics & Gynecology

## 2015-10-22 DIAGNOSIS — N950 Postmenopausal bleeding: Secondary | ICD-10-CM | POA: Diagnosis not present

## 2015-10-22 LAB — BASIC METABOLIC PANEL
Anion gap: 9 (ref 5–15)
BUN: 12 mg/dL (ref 6–20)
CHLORIDE: 102 mmol/L (ref 101–111)
CO2: 25 mmol/L (ref 22–32)
Calcium: 8.8 mg/dL — ABNORMAL LOW (ref 8.9–10.3)
Creatinine, Ser: 0.82 mg/dL (ref 0.44–1.00)
GFR calc non Af Amer: >60 mL/min (ref >60)
Glucose, Bld: 127 mg/dL — ABNORMAL HIGH (ref 65–99)
POTASSIUM: 3.7 mmol/L (ref 3.5–5.1)
SODIUM: 136 mmol/L (ref 135–145)

## 2015-10-22 LAB — CBC
HEMATOCRIT: 34 % — AB (ref 36.0–46.0)
HEMOGLOBIN: 11 g/dL — AB (ref 12.0–15.0)
MCH: 28.4 pg (ref 26.0–34.0)
MCHC: 32.4 g/dL (ref 30.0–36.0)
MCV: 87.6 fL (ref 78.0–100.0)
Platelets: 418 10*3/uL — ABNORMAL HIGH (ref 150–400)
RBC: 3.88 MIL/uL (ref 3.87–5.11)
RDW: 15 % (ref 11.5–15.5)
WBC: 15.4 10*3/uL — ABNORMAL HIGH (ref 4.0–10.5)

## 2015-10-22 MED ORDER — DOCUSATE SODIUM 100 MG PO CAPS: 100.0000 mg | ORAL_CAPSULE | Freq: Two times a day (BID) | ORAL | Status: DC

## 2015-10-22 MED ORDER — IBUPROFEN 600 MG PO TABS
600.0000 mg | ORAL_TABLET | Freq: Four times a day (QID) | ORAL | Status: DC | PRN
  Administered 2015-10-22: 600 mg via ORAL
  Filled 2015-10-22: qty 1

## 2015-10-22 MED ORDER — HYDROCODONE-ACETAMINOPHEN 5-325 MG PO TABS: 1.0000 | ORAL_TABLET | Freq: Four times a day (QID) | ORAL | Status: DC | PRN

## 2015-10-22 MED ORDER — IBUPROFEN 600 MG PO TABS: 600.0000 mg | ORAL_TABLET | Freq: Four times a day (QID) | ORAL | Status: DC | PRN

## 2015-10-22 NOTE — Progress Notes (Signed)
Vaginal packing removed as ordered. Patient tolerated well. Minimal amount of drainage noted.

## 2015-10-22 NOTE — Anesthesia Postprocedure Evaluation (Signed)
Anesthesia Post Note  Patient: Tina PitterKaren Valdez  Procedure(s) Performed: Procedure(s) (LRB): LAPAROSCOPIC ASSISTED VAGINAL HYSTERECTOMY  (Bilateral) BILATERAL SALPINGECTOMY (Bilateral)  Patient location during evaluation: [facility] Anesthesia Type: General Level of consciousness: awake and alert and oriented Pain management: pain level controlled Vital Signs Assessment: post-procedure vital signs reviewed and stable Respiratory status: spontaneous breathing Cardiovascular status: blood pressure returned to baseline Anesthetic complications: no    Last Vitals:  Filed Vitals:   10/22/15 0114 10/22/15 0557  BP: 114/63 105/67  Pulse: 76 87  Temp: 36.7 C 36.9 C  Resp: 22 20    Last Pain:  Filed Vitals:   10/22/15 0627  PainSc: 2                  Rajeev Escue

## 2015-10-22 NOTE — Addendum Note (Signed)
Addendum  created 10/22/15 0730 by Junious SilkMelinda Frieda Arnall, CRNA   Modules edited: Clinical Notes   Clinical Notes:  File: [id number]

## 2015-10-22 NOTE — Progress Notes (Signed)
Pt discharged to home with husband.  Condition stable.  Pt to car via wheelchair with RN.  No equipment for home ordered at discharge. 

## 2015-10-22 NOTE — Discharge Instructions (Signed)
Laparoscopically Assisted Vaginal Hysterectomy, Care After °Refer to this sheet in the next few weeks. These instructions provide you with information on caring for yourself after your procedure. Your health care provider may also give you more specific instructions. Your treatment has been planned according to current medical practices, but problems sometimes occur. Call your health care provider if you have any problems or questions after your procedure. °WHAT TO EXPECT AFTER THE PROCEDURE °After your procedure, it is typical to have the following: °· Abdominal pain. You will be given pain medicine to control it. °· Sore throat from the breathing tube that was inserted during surgery. °HOME CARE INSTRUCTIONS °· Only take over-the-counter or prescription medicines for pain, discomfort, or fever as directed by your health care provider.  For pain management please alternate between percocet and motrin.  Percocet may cause constipation, so please take the Colace (stool softener) twice daily while on this medication. °· Do not take aspirin. It can cause bleeding. °· Do not drive when taking pain medicine. °· Follow your health care provider's advice regarding diet, exercise, lifting, driving, and general activities. °· Resume your usual diet as directed and allowed. °· Get plenty of rest and sleep. °· Do not douche, use tampons, or have sexual intercourse for at least 6 weeks, or until your health care provider gives you permission. °· Change your bandages (dressings) as directed by your health care provider. °· Monitor your temperature and notify your health care provider of a fever. °· Take showers instead of baths for 2-3 weeks. °· Do not drink alcohol until your health care provider gives you permission. °· If you develop constipation, you may take a mild laxative with your health care provider's permission. Bran foods may help with constipation problems. Drinking enough fluids to keep your urine clear or pale  yellow may help as well. °· Try to have someone home with you for 1-2 weeks to help around the house. °· Keep all of your follow-up appointments as directed by your health care provider. °SEEK MEDICAL CARE IF:  °· You have swelling, redness, or increasing pain around your incision sites. °· You have pus coming from your incision. °· You notice a bad smell coming from your incision. °· Your incision breaks open. °· You feel dizzy or lightheaded. °· You have pain or bleeding when you urinate. °· You have persistent diarrhea. °· You have persistent nausea and vomiting. °· You have abnormal vaginal discharge. °· You have a rash. °· You have any type of abnormal reaction or develop an allergy to your medicine. °· You have poor pain control with your prescribed medicine. °SEEK IMMEDIATE MEDICAL CARE IF:  °· You have a fever. °· You have severe abdominal pain. °· You have chest pain. °· You have shortness of breath. °· You faint. °· You have pain, swelling, or redness in your leg. °· You have heavy vaginal bleeding with blood clots. °MAKE SURE YOU: °· Understand these instructions. °· Will watch your condition. °· Will get help right away if you are not doing well or get worse. °  °This information is not intended to replace advice given to you by your health care provider. Make sure you discuss any questions you have with your health care provider. °  °Document Released: 09/29/2011 Document Revised: 10/15/2013 Document Reviewed: 04/25/2013 °Elsevier Interactive Patient Education ©2016 [employer] ° ° °

## 2015-10-22 NOTE — Progress Notes (Signed)
Postoperative Note Day # 1  S:  Patient resting comfortable in chair.  Pain better controlled today.  Tolerating clears and some crackers. No flatus, no BM.  Vaginal packing removed this am, minimal spotting noted.  Ambulating without difficulty.  She denies n/v/f/c, SOB, or CP.  No acute complaints.  O: Temp:  [97.5 F (36.4 C)-98.6 F (37 C)] 98.5 F (36.9 C) (12/29 0557) Pulse Rate:  [75-90] 87 (12/29 0557) Resp:  [10-22] 20 (12/29 0557) BP: (93-142)/(51-82) 105/67 mmHg (12/29 0557) SpO2:  [93 %-100 %] 96 % (12/29 0557) Weight:  [136.079 kg (300 lb)] 136.079 kg (300 lb) (12/28 1730)   Gen: A&Ox3, NAD CV: RRR, no MRG Resp: CTAB Abdomen: obese, soft, NT, ND BS quiet Incision: c/d/i with dermabond Ext: No edema, no calf tenderness bilaterally  Labs:  Results for orders placed or performed during the hospital encounter of 10/21/15 (from the past 24 hour(s))  CBC     Status: Abnormal   Collection Time: 10/22/15  5:43 AM  Result Value Ref Range   WBC 15.4 (H) 4.0 - 10.5 K/uL   RBC 3.88 3.87 - 5.11 MIL/uL   Hemoglobin 11.0 (L) 12.0 - 15.0 g/dL   HCT 16.134.0 (L) 09.636.0 - 04.546.0 %   MCV 87.6 78.0 - 100.0 fL   MCH 28.4 26.0 - 34.0 pg   MCHC 32.4 30.0 - 36.0 g/dL   RDW 40.915.0 81.111.5 - 91.415.5 %   Platelets 418 (H) 150 - 400 K/uL  Basic metabolic panel     Status: Abnormal   Collection Time: 10/22/15  5:43 AM  Result Value Ref Range   Sodium 136 135 - 145 mmol/L   Potassium 3.7 3.5 - 5.1 mmol/L   Chloride 102 101 - 111 mmol/L   CO2 25 22 - 32 mmol/L   Glucose, Bld 127 (H) 65 - 99 mg/dL   BUN 12 6 - 20 mg/dL   Creatinine, Ser 7.820.82 0.44 - 1.00 mg/dL   Calcium 8.8 (L) 8.9 - 10.3 mg/dL   GFR calc non Af Amer >60 >60 mL/min   GFR calc Af Amer >60 >60 mL/min   Anion gap 9 5 - 15   A/P: Pt is a 59 y.o. s/p LAVH, BS, POD#1  - Pain well controlled -GU: UOP is adequate, foley removed this am.  Pt already voided a small amount this am, will continue to monitor -GI: Advance to general diet as  tolerated -Activity: encouraged sitting up to chair and ambulation as tolerated -Prophylaxis: early ambulation, SCDs while in bed -Labs: stable as above  Pt to work on ambulation and voiding on her own today to help with passage of flatus.  Plan for discharge home later today.  Myna HidalgoJennifer Mazzie Brodrick, DO [phone] (pager) [phone] (office)

## 2015-10-24 ENCOUNTER — Inpatient Hospital Stay (HOSPITAL_COMMUNITY): Payer: BLUE CROSS/BLUE SHIELD

## 2015-10-24 ENCOUNTER — Telehealth: Payer: Self-pay | Admitting: Certified Nurse Midwife

## 2015-10-24 ENCOUNTER — Inpatient Hospital Stay (HOSPITAL_COMMUNITY)
Admission: AD | Admit: 2015-10-24 | Discharge: 2015-10-24 | Disposition: A | Discharge status: Home / Self Care | Payer: BLUE CROSS/BLUE SHIELD | Source: Ambulatory Visit | Attending: Obstetrics & Gynecology | Admitting: Obstetrics & Gynecology

## 2015-10-24 ENCOUNTER — Encounter (HOSPITAL_COMMUNITY): Payer: Self-pay | Admitting: *Deleted

## 2015-10-24 DIAGNOSIS — R109 Unspecified abdominal pain: Secondary | ICD-10-CM

## 2015-10-24 DIAGNOSIS — G8918 Other acute postprocedural pain: Secondary | ICD-10-CM

## 2015-10-24 DIAGNOSIS — K913 Postprocedural intestinal obstruction: Secondary | ICD-10-CM

## 2015-10-24 DIAGNOSIS — K567 Ileus, unspecified: Secondary | ICD-10-CM

## 2015-10-24 DIAGNOSIS — Z90710 Acquired absence of both cervix and uterus: Secondary | ICD-10-CM | POA: Diagnosis not present

## 2015-10-24 DIAGNOSIS — Z90722 Acquired absence of ovaries, bilateral: Secondary | ICD-10-CM | POA: Insufficient documentation

## 2015-10-24 DIAGNOSIS — K9189 Other postprocedural complications and disorders of digestive system: Secondary | ICD-10-CM

## 2015-10-24 LAB — COMPREHENSIVE METABOLIC PANEL
ALBUMIN: 3 g/dL — AB (ref 3.5–5.0)
ALK PHOS: 95 U/L (ref 38–126)
ALT: 82 U/L — ABNORMAL HIGH (ref 14–54)
ANION GAP: 11 (ref 5–15)
AST: 34 U/L (ref 15–41)
BILIRUBIN TOTAL: 0.8 mg/dL (ref 0.3–1.2)
BUN: 11 mg/dL (ref 6–20)
CALCIUM: 9.3 mg/dL (ref 8.9–10.3)
CO2: 25 mmol/L (ref 22–32)
Chloride: 104 mmol/L (ref 101–111)
Creatinine, Ser: 0.76 mg/dL (ref 0.44–1.00)
GFR calc Af Amer: >60 mL/min (ref >60)
GLUCOSE: 129 mg/dL — AB (ref 65–99)
POTASSIUM: 4.3 mmol/L (ref 3.5–5.1)
Sodium: 140 mmol/L (ref 135–145)
TOTAL PROTEIN: 6.4 g/dL — AB (ref 6.5–8.1)

## 2015-10-24 LAB — CBC
HEMATOCRIT: 36.1 % (ref 36.0–46.0)
HEMOGLOBIN: 12.3 g/dL (ref 12.0–15.0)
MCH: 29.5 pg (ref 26.0–34.0)
MCHC: 34.1 g/dL (ref 30.0–36.0)
MCV: 86.6 fL (ref 78.0–100.0)
Platelets: 484 10*3/uL — ABNORMAL HIGH (ref 150–400)
RBC: 4.17 MIL/uL (ref 3.87–5.11)
RDW: 15 % (ref 11.5–15.5)
WBC: 25.2 10*3/uL — ABNORMAL HIGH (ref 4.0–10.5)

## 2015-10-24 LAB — DIFFERENTIAL
BASOS ABS: 0 10*3/uL (ref 0.0–0.1)
Basophils Relative: 0 %
EOS ABS: 0 10*3/uL (ref 0.0–0.7)
EOS PCT: 0 %
LYMPHS PCT: 4 %
Lymphs Abs: 1 10*3/uL (ref 0.7–4.0)
MONOS PCT: 8 %
Monocytes Absolute: 2 10*3/uL — ABNORMAL HIGH (ref 0.1–1.0)
Neutro Abs: 22.2 10*3/uL — ABNORMAL HIGH (ref 1.7–7.7)
Neutrophils Relative %: 88 %
Other: 0 %
WBC MORPHOLOGY: INCREASED

## 2015-10-24 LAB — URINALYSIS, ROUTINE W REFLEX MICROSCOPIC
GLUCOSE, UA: 100 mg/dL — AB
KETONES UR: 15 mg/dL — AB
Nitrite: NEGATIVE
PROTEIN: 100 mg/dL — AB
Specific Gravity, Urine: >1.03 — ABNORMAL HIGH (ref 1.005–1.030)
pH: 6 (ref 5.0–8.0)

## 2015-10-24 LAB — URINE MICROSCOPIC-ADD ON

## 2015-10-24 MED ORDER — SULFAMETHOXAZOLE-TRIMETHOPRIM 800-160 MG PO TABS: 1.0000 | ORAL_TABLET | Freq: Two times a day (BID) | ORAL | Status: DC

## 2015-10-24 MED ORDER — ONDANSETRON HCL 4 MG PO TABS: 4.0000 mg | ORAL_TABLET | Freq: Four times a day (QID) | ORAL | Status: DC | PRN

## 2015-10-24 MED ORDER — ONDANSETRON HCL 4 MG/2ML IJ SOLN
4.0000 mg | Freq: Once | INTRAMUSCULAR | Status: AC
  Administered 2015-10-24: 4 mg via INTRAVENOUS
  Filled 2015-10-24: qty 2

## 2015-10-24 MED ORDER — FLEET ENEMA 7-19 GM/118ML RE ENEM
1.0000 | ENEMA | Freq: Once | RECTAL | Status: AC
  Administered 2015-10-24: 1 via RECTAL

## 2015-10-24 MED ORDER — KETOROLAC TROMETHAMINE 30 MG/ML IJ SOLN
30.0000 mg | Freq: Once | INTRAMUSCULAR | Status: AC
  Administered 2015-10-24: 30 mg via INTRAMUSCULAR
  Filled 2015-10-24: qty 1

## 2015-10-24 MED ORDER — LACTATED RINGERS IV SOLN
INTRAVENOUS | Status: DC
  Administered 2015-10-24: 11:00:00 via INTRAVENOUS

## 2015-10-24 NOTE — MAU Note (Signed)
S/P hysterectomy 12/28. Vomiting, voiding small amounts, no BM, lots of pain, pressure and shooting pain.

## 2015-10-24 NOTE — Progress Notes (Addendum)
Name: Charlie PitterCoward, Corinth[place].    MRN: [id number]  S.   Patient feels so much better after passing gas and having a bowel movement after the fleet enema. She denies any more abdominal pain. Denies any more nausea or vomiting. Has tolerated liquid diet.  She desires to go home.   Tawana Scale. Filed Vitals:   10/24/15 0945 10/24/15 1305  BP: 120/87 104/67  Pulse: 113 105  Temp: 98 F (36.7 C) 98.2 F (36.8 C)  Resp: 22 18   Gen: No acute distress, well nourished.   Abd: Soft, obese, non-tender.  Non-distended.  Positive bowel sounds in all four quadrants.  Ext: Warm and well purfused.    Abdominal x-ray: 10/24/15:  Upright film shows small volume gas in the stomach. Stomach is distended with fluid. Small bowel loops measuring up to 3.3 cm in diameter demonstrate air-fluid levels. Supine film shows gas distended small bowel in the left abdomen showing up to 4.1 cm. Small volume gas is visible in the transverse colon. Spine films also show gas loculation in the abdominal wall, presumably secondary to the recent surgery.  IMPRESSION: Moderately distended small bowel loops in the left abdomen with air-fluid levels. Stomach is distended and fluid-filled. Given that the patient is postop day 3, imaging features likely relate to postoperative or adynamic ileus. Serial x-rays could be used to assess for progression.   A/P: 59 y/o P2 Postop day 3 after a laparoscopic assisted vaginal hysterectomy and bilateral salpingectomy, with post-op ileus, improved status and desiring outpatient management, able to tolerate liquid diet  -Try regular diet trial, if able to tolerate it will discharge to home with ileus and infection precautions, otherwise admit for inpatient management.   -Urine culture pending, treat possible UTI with bactrim ds tabs.  -All questions answered at bedside.  -Follow up as with Gyn within 1 week.    Dr. Sallye OberKulwa.

## 2015-10-24 NOTE — Progress Notes (Signed)
Patient states she passed gas and had BM following Fleets enema. States she feels better and her pain is minimal.

## 2015-10-24 NOTE — Progress Notes (Addendum)
Dr. Sallye OberKulwa  in to see patient and discuss plan of care and F/U.

## 2015-10-24 NOTE — Discharge Instructions (Signed)
Ileus ° Ileus is a condition in which the intestines, also called the bowels, stop working and moving correctly. If the intestines stop working, food cannot pass through to get digested. The intestines are hollow organs that digest food after the food leaves the stomach. These organs are long, muscular tubes that connect the stomach to the rectum. When ileus occurs, the muscular contractions that cause food to move through the intestines stop happening as they normally would. °Ileus can occur for various reasons. This condition is a serious problem that usually requires hospitalization. It can cause symptoms such as nausea, abdominal pain, and bloating. Ileus can last from a few hours to a few days. If the intestines stop working because of a blockage, that is a different condition that is called a bowel obstruction. °CAUSES °This condition may be caused by: °· Surgery on the abdomen. °· An infection or inflammation in the abdomen. This includes inflammation of the lining of the abdomen (peritonitis). °· Infection or inflammation in other parts of the body, such as pneumonia or pancreatitis. °· Passage of gallstones or kidney stones. °· Damage to the nerves or blood vessels that go to the intestines. °· A collection of blood within the abdominal cavity. °· Imbalance in the salts in the blood (electrolytes). °· Injury to the brain or spinal cord. °· Medicines. Many medicines, including strong pain medicines, can cause ileus or make it worse. °SYMPTOMS °Symptoms of this condition include: °· Bloating of the abdomen. °· Pain or discomfort in the abdomen. °· Poor appetite. °· Nausea and vomiting. °· Lack of normal bowel sounds, such as "growling" in the stomach. °DIAGNOSIS °This condition may be diagnosed with: °· A physical exam and medical history. °· X-rays or a CT scan of the abdomen. °You may also have other tests to help find the cause of the condition. °TREATMENT °Treatment for this condition may  include: °· Resting the intestines until they start to work again. This is often done by: °¨ Stopping oral intake of food and drink. You will be given fluid through an IV tube to prevent dehydration. °¨ Placing a small tube (nasogastric tube or NG tube) that is passed through your nose and into your stomach. The tube is attached to a suction device and keeps the stomach emptied out. This allows the bowels to rest and also helps to reduce nausea and vomiting. °· Correcting any electrolyte imbalance by giving supplements in the IV fluid. °· Stopping any medicines that might make ileus worse. °· Treating any condition that may have caused ileus. °HOME CARE INSTRUCTIONS °· Follow instructions from your health care provider about diet and fluid intake. Usually, you will be told to: °¨ Drink plenty of clear fluids. °¨ Avoid alcohol. °¨ Avoid caffeine. °¨ Eat a bland diet. °· Get plenty of rest. Return to your normal activities as told by your health care provider. °· Take over-the-counter and prescription medicines only as told by your health care provider. °· Keep all follow-up visits as told by your health care provider. This is important. °SEEK MEDICAL CARE IF: °· You have nausea, vomiting, or abdominal discomfort. °· You have a fever. °SEEK IMMEDIATE MEDICAL CARE IF: °· You have severe abdominal pain or bloating. °· You cannot eat or drink without vomiting. °  °This information is not intended to replace advice given to you by your health care provider. Make sure you discuss any questions you have with your health care provider. °  °Document Released: 10/13/2003 Document Revised: 07/01/2015 Document   Reviewed: 12/04/2014 °Elsevier Interactive Patient Education ©2016 [employer] ° °

## 2015-10-24 NOTE — MAU Provider Note (Signed)
Chief Complaint: Post-op Problem   None     SUBJECTIVE HPI: Tina Valdez is a 59 y.o. G2P1101 who presents to maternity admissions reporting no BM since vaginal hysterectomy on 12/28 with abdominal pain and distention starting immediately after surgery on 12/28 but increasing daily since then. She reports she has tried Theme park managerMiralax and Colace but is still not passing gas or having a BM.  She also reports she is urinating but only small amounts each time. She is taking ibuprofen and Percocet but not as often as prescribed as she reports she does not like to take pain medication.  She started vomiting last night and vomiting 4-5 times during the night.  She has not taken any medication for this and nothing seems to make it better or worse. She denies vaginal bleeding, vaginal itching/burning, h/a, dizziness, or fever/chills.     HPI  Past Medical History  Diagnosis Date  . Medical history non-contributory    Past Surgical History  Procedure Laterality Date  . Cesarean section    . Pilonidal cyst excision    . Dilation and curettage of uterus    . Cholecystectomy    . Ankle surgery    . Carpal tunnel release      Left wrist  . Laparoscopic vaginal hysterectomy with salpingectomy Bilateral 10/21/2015    Procedure: LAPAROSCOPIC ASSISTED VAGINAL HYSTERECTOMY ;  Surgeon: Myna HidalgoJennifer Ozan, DO;  Location: [facility];  Service: Gynecology;  Laterality: Bilateral;  . Bilateral salpingectomy Bilateral 10/21/2015    Procedure: BILATERAL SALPINGECTOMY;  Surgeon: Myna HidalgoJennifer Ozan, DO;  Location: [facility];  Service: Gynecology;  Laterality: Bilateral;   Social History   Social History  . Marital Status: Married    Spouse Name: N/A  . Number of Children: N/A  . Years of Education: N/A   Occupational History  . Not on file.   Social History Main Topics  . Smoking status: Never Smoker   . Smokeless tobacco: Not on file  . Alcohol Use: No  . Drug Use: No  . Sexual Activity: Yes    Birth Control/  Protection: Post-menopausal   Other Topics Concern  . Not on file   Social History Narrative   No current facility-administered medications on file prior to encounter.   Current Outpatient Prescriptions on File Prior to Encounter  Medication Sig Dispense Refill  . docusate sodium (COLACE) 100 MG capsule Take 1 capsule (100 mg total) by mouth 2 (two) times daily. 30 capsule 0  . HYDROcodone-acetaminophen (NORCO/VICODIN) 5-325 MG tablet Take 1-2 tablets by mouth every 6 (six) hours as needed for moderate pain. 30 tablet 0  . ibuprofen (ADVIL,MOTRIN) 600 MG tablet Take 1 tablet (600 mg total) by mouth every 6 (six) hours as needed for mild pain. 30 tablet 0   Allergies  Allergen Reactions  . Bee Venom Swelling    ROS:  Review of Systems  Constitutional: Negative for fever, chills and fatigue.  Respiratory: Negative for shortness of breath.   Cardiovascular: Negative for chest pain.  Gastrointestinal: Positive for vomiting, abdominal pain, constipation and abdominal distention.  Genitourinary: Negative for dysuria, flank pain, vaginal bleeding, vaginal discharge, difficulty urinating, vaginal pain and pelvic pain.  Neurological: Negative for dizziness and headaches.  Psychiatric/Behavioral: Negative.      I have reviewed patient's Past Medical Hx, Surgical Hx, Family Hx, Social Hx, medications and allergies.   Physical Exam   Patient Vitals for the past 24 hrs:  BP Temp Temp src Pulse Resp SpO2  10/24/15 1305 104/67  mmHg 98.2 F (36.8 C) Oral 105 18 95 %  10/24/15 0951 - - - - - 94 %  10/24/15 0945 120/87 mmHg 98 F (36.7 C) Oral 113 22 94 %   Constitutional: Well-developed, well-nourished female in no acute distress.  Cardiovascular: normal rate Respiratory: normal effort GI: Abd soft, mildly distended, generalized tenderness across lower and mid abdomen. Pos BS x 4 MS: Extremities nontender, no edema, normal ROM Neurologic: Alert and oriented x 4.  GU: Neg  CVAT.   LAB RESULTS Results for orders placed or performed during the hospital encounter of 10/24/15 (from the past 24 hour(s))  CBC     Status: Abnormal   Collection Time: 10/24/15 10:45 AM  Result Value Ref Range   WBC 25.2 (H) 4.0 - 10.5 K/uL   RBC 4.17 3.87 - 5.11 MIL/uL   Hemoglobin 12.3 12.0 - 15.0 g/dL   HCT 82.9 56.2 - 13.0 %   MCV 86.6 78.0 - 100.0 fL   MCH 29.5 26.0 - 34.0 pg   MCHC 34.1 30.0 - 36.0 g/dL   RDW 86.5 78.4 - 69.6 %   Platelets 484 (H) 150 - 400 K/uL  Comprehensive metabolic panel     Status: Abnormal   Collection Time: 10/24/15 10:45 AM  Result Value Ref Range   Sodium 140 135 - 145 mmol/L   Potassium 4.3 3.5 - 5.1 mmol/L   Chloride 104 101 - 111 mmol/L   CO2 25 22 - 32 mmol/L   Glucose, Bld 129 (H) 65 - 99 mg/dL   BUN 11 6 - 20 mg/dL   Creatinine, Ser 2.95 0.44 - 1.00 mg/dL   Calcium 9.3 8.9 - 28.4 mg/dL   Total Protein 6.4 (L) 6.5 - 8.1 g/dL   Albumin 3.0 (L) 3.5 - 5.0 g/dL   AST 34 15 - 41 U/L   ALT 82 (H) 14 - 54 U/L   Alkaline Phosphatase 95 38 - 126 U/L   Total Bilirubin 0.8 0.3 - 1.2 mg/dL   GFR calc non Af Amer >60 >60 mL/min   GFR calc Af Amer >60 >60 mL/min   Anion gap 11 5 - 15  Differential     Status: Abnormal   Collection Time: 10/24/15 10:45 AM  Result Value Ref Range   Neutrophils Relative % 88 %   Lymphocytes Relative 4 %   Monocytes Relative 8 %   Eosinophils Relative 0 %   Basophils Relative 0 %   Other 0 %   Neutro Abs 22.2 (H) 1.7 - 7.7 K/uL   Lymphs Abs 1.0 0.7 - 4.0 K/uL   Monocytes Absolute 2.0 (H) 0.1 - 1.0 K/uL   Eosinophils Absolute 0.0 0.0 - 0.7 K/uL   Basophils Absolute 0.0 0.0 - 0.1 K/uL   WBC Morphology INCREASED BANDS (>20% BANDS)   Urinalysis, Routine w reflex microscopic (not at [facility])     Status: Abnormal   Collection Time: 10/24/15 12:13 PM  Result Value Ref Range   Color, Urine YELLOW YELLOW   APPearance CLEAR CLEAR   Specific Gravity, Urine >1.030 (H) 1.005 - 1.030   pH 6.0 5.0 - 8.0   Glucose, UA  100 (A) NEGATIVE mg/dL   Hgb urine dipstick SMALL (A) NEGATIVE   Bilirubin Urine MODERATE (A) NEGATIVE   Ketones, ur 15 (A) NEGATIVE mg/dL   Protein, ur 132 (A) NEGATIVE mg/dL   Nitrite NEGATIVE NEGATIVE   Leukocytes, UA TRACE (A) NEGATIVE  Urine microscopic-add on     Status: Abnormal  Collection Time: 10/24/15 12:13 PM  Result Value Ref Range   Squamous Epithelial / LPF 0-5 (A) NONE SEEN   WBC, UA 6-30 0 - 5 WBC/hpf   RBC / HPF 6-30 0 - 5 RBC/hpf   Bacteria, UA MANY (A) NONE SEEN   Urine-Other MUCOUS PRESENT        IMAGING Dg Abd 2 Views  10/24/2015  CLINICAL DATA:  Initial encounter for shooting abdominal pain and pressure that radiates to right side of abdomen after surgery on 10/21/2015. EXAM: ABDOMEN - 2 VIEW COMPARISON:  None. FINDINGS: Upright film shows small volume gas in the stomach. Stomach is distended with fluid. Small bowel loops measuring up to 3.3 cm in diameter demonstrate air-fluid levels. Supine film shows gas distended small bowel in the left abdomen showing up to 4.1 cm. Small volume gas is visible in the transverse colon. Spine films also show gas loculation in the abdominal wall, presumably secondary to the recent surgery. IMPRESSION: Moderately distended small bowel loops in the left abdomen with air-fluid levels. Stomach is distended and fluid-filled. Given that the patient is postop day 3, imaging features likely relate to postoperative or adynamic ileus. Serial x-rays could be used to assess for progression. Electronically Signed   By: Kennith Center M.D.   On: 10/24/2015 11:40    MAU Management/MDM: Consult Dr Sallye Ober following physical assessment.  Plan for CBC, CMP, IV with LR at 125, Toradol 30 mg IV, and Zofran 4 mg IV.  Abdominal X-ray, 2 view ordered.  Reviewed lab and X-ray results.  Called Dr Sallye Ober with results.  Fleets enema given in MAU with positive results of flatus and BM.  Pt reports improvement in abdominal pain.  Dr Sallye Ober to bedside to see pt.  Pt  tolerated PO liquids and light foods in MAU.  Plan to D/C home at this time and f/u in office.  Precautions given/reason to return to MAU. Pt stable at time of discharge.  ASSESSMENT 1. Ileus, postoperative   2. Postoperative abdominal pain     PLAN Discharge home with abdominal pain/bowel precautions F/U in office with Dr Charlotta Newton this week Return to MAU as needed for emergencies    Medication List    TAKE these medications        docusate sodium 100 MG capsule  Commonly known as:  COLACE  Take 1 capsule (100 mg total) by mouth 2 (two) times daily.     HYDROcodone-acetaminophen 5-325 MG tablet  Commonly known as:  NORCO/VICODIN  Take 1-2 tablets by mouth every 6 (six) hours as needed for moderate pain.     ibuprofen 600 MG tablet  Commonly known as:  ADVIL,MOTRIN  Take 1 tablet (600 mg total) by mouth every 6 (six) hours as needed for mild pain.     MIRALAX PO  Take 1 Dose by mouth daily as needed.       Follow-up Information    Follow up with Myna Hidalgo, M, DO.   Specialty:  Obstetrics and Gynecology   Why:  Next week   Contact information:   [address] [place] [address] [phone]       Follow up with [facility] [place] MATERNITY ADMISSIONS.   Why:  As needed for emergencies   Contact information:   [address] [id number] mc [place] [address] [phone]      Sharen Counter Certified Nurse-Midwife 10/24/2015  4:56 PM

## 2015-10-24 NOTE — Telephone Encounter (Signed)
Returned Telephone call from Tina ButteryPete Frazer (via [employer] answering service) :    Tina Valdez reports his wife, Tina Valdez had Surgery/ Histerectomy on 10/21/15 . Since then she has not had a BM and has not passed gas, despite taking a laxative ( possibly Miralax?) both yesterday and today and her stomach is increasingly distended. Additionally, as of last night, she has been vomiting and her "temp was 100 but is now back down to 98 today". She is also only urinating a small amount "despite drinking plenty of fluids".    [name] was advised [facility] in [facility].  MAU, Faculty practice and Dr. Sallye OberKulwa informed of patient's imminent arrival.

## 2015-10-30 NOTE — Discharge Summary (Signed)
Physician Discharge Summary  Patient ID: Tina PitterKaren Razzano MRN: [id number] DOB/AGE: 59/01/1956 59 y.o.  Admit date: 10/21/2015 Discharge date: 10/22/2015  Admission Diagnoses:  Discharge Diagnoses:  Active Problems:   Postmenopausal bleeding   Discharged Condition: stable  Hospital Course: 60yo postmenopausal female who presents for LAVH, bilateral salpingectomy, possible open hysterectomy due to postmenopausal bleeding. She underwent an LAVH, BS, Lysis of adhesions- for information regarding the procedure please see the operative note. Her postoperative course was uncomplicated as she was meeting her postop milestones appropriately. She was discharged home on POD#1 in stable condition.  Consults: None  Significant Diagnostic Studies: labs:  CBC Latest Ref Rng 10/24/2015 10/22/2015 10/09/2015  WBC 4.0 - 10.5 K/uL 25.2(H) 15.4(H) 8.5  Hemoglobin 12.0 - 15.0 g/dL 64.412.3 11.0(L) 13.7  Hematocrit 36.0 - 46.0 % 36.1 34.0(L) 41.8  Platelets 150 - 400 K/uL 484(H) 418(H) 419(H)    Treatments: IV hydration, antibiotics: Ancef, analgesia: Toradol, percocet and surgery: LAVH, BS, LOA  Discharge Exam: Blood pressure 103/57, pulse 99, temperature 98.5 F (36.9 C), temperature source Oral, resp. rate 20, height 5\' 5"  (1.651 m), weight 136.079 kg (300 lb), SpO2 91 %. Gen: A&Ox3, NAD CV: RRR Resp: CTAB Abdomen: soft, NT, ND, +BS present but quiet Incision: c/d/i with dermabond Ext: No edema, no calf tenderness bilaterally  Disposition: 01-Home or Self Care     Medication List    STOP taking these medications        medroxyPROGESTERone 10 MG tablet  Commonly known as:  PROVERA      TAKE these medications        docusate sodium 100 MG capsule  Commonly known as:  COLACE  Take 1 capsule (100 mg total) by mouth 2 (two) times daily.     HYDROcodone-acetaminophen 5-325 MG tablet  Commonly known as:  NORCO/VICODIN  Take 1-2 tablets by mouth every 6 (six) hours as needed for moderate  pain.     ibuprofen 600 MG tablet  Commonly known as:  ADVIL,MOTRIN  Take 1 tablet (600 mg total) by mouth every 6 (six) hours as needed for mild pain.           Follow-up Information    Follow up with Myna HidalgoZAN, Tennis Mckinnon, M, DO In 2 weeks.   Specialty:  Obstetrics and Gynecology   Contact information:   [address] [place] [id number] [phone]       Signed: Sharon SellerOZAN, Mindel Friscia, M 10/30/2015, 6:48 AM

## 2016-10-19 IMAGING — CR DG ABDOMEN 2V
4 series · 4 of 4 positions shown · non-contrast
Comparison: None.

CLINICAL DATA: Initial encounter for shooting abdominal pain and
pressure that radiates to right side of abdomen after surgery on
10/21/2015.

EXAM:
ABDOMEN - 2 VIEW

[abdomen erect (1 of 2)]
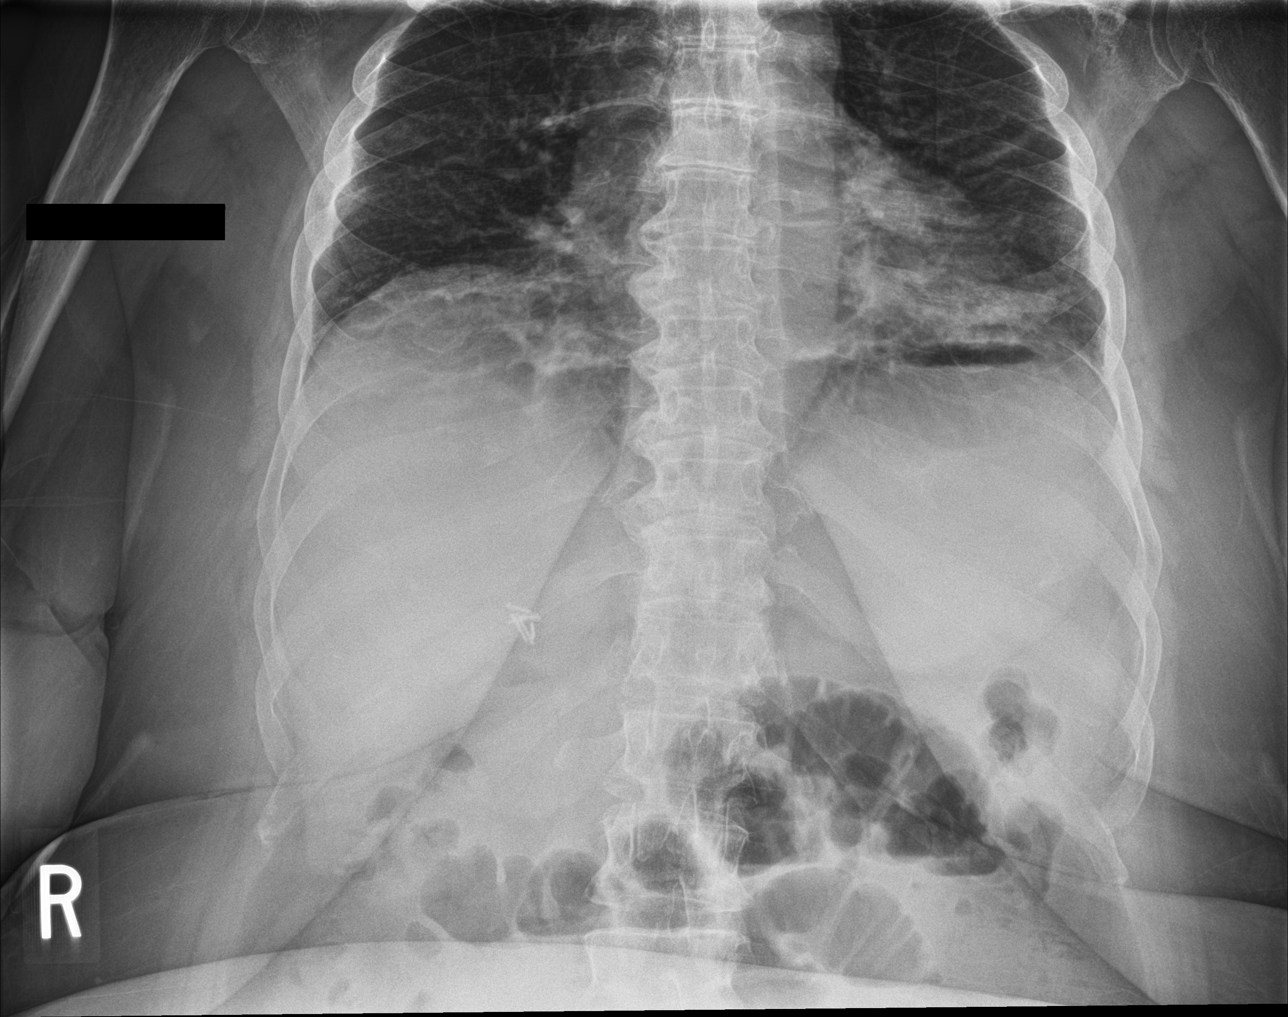

[abdomen supine (1 of 2)]
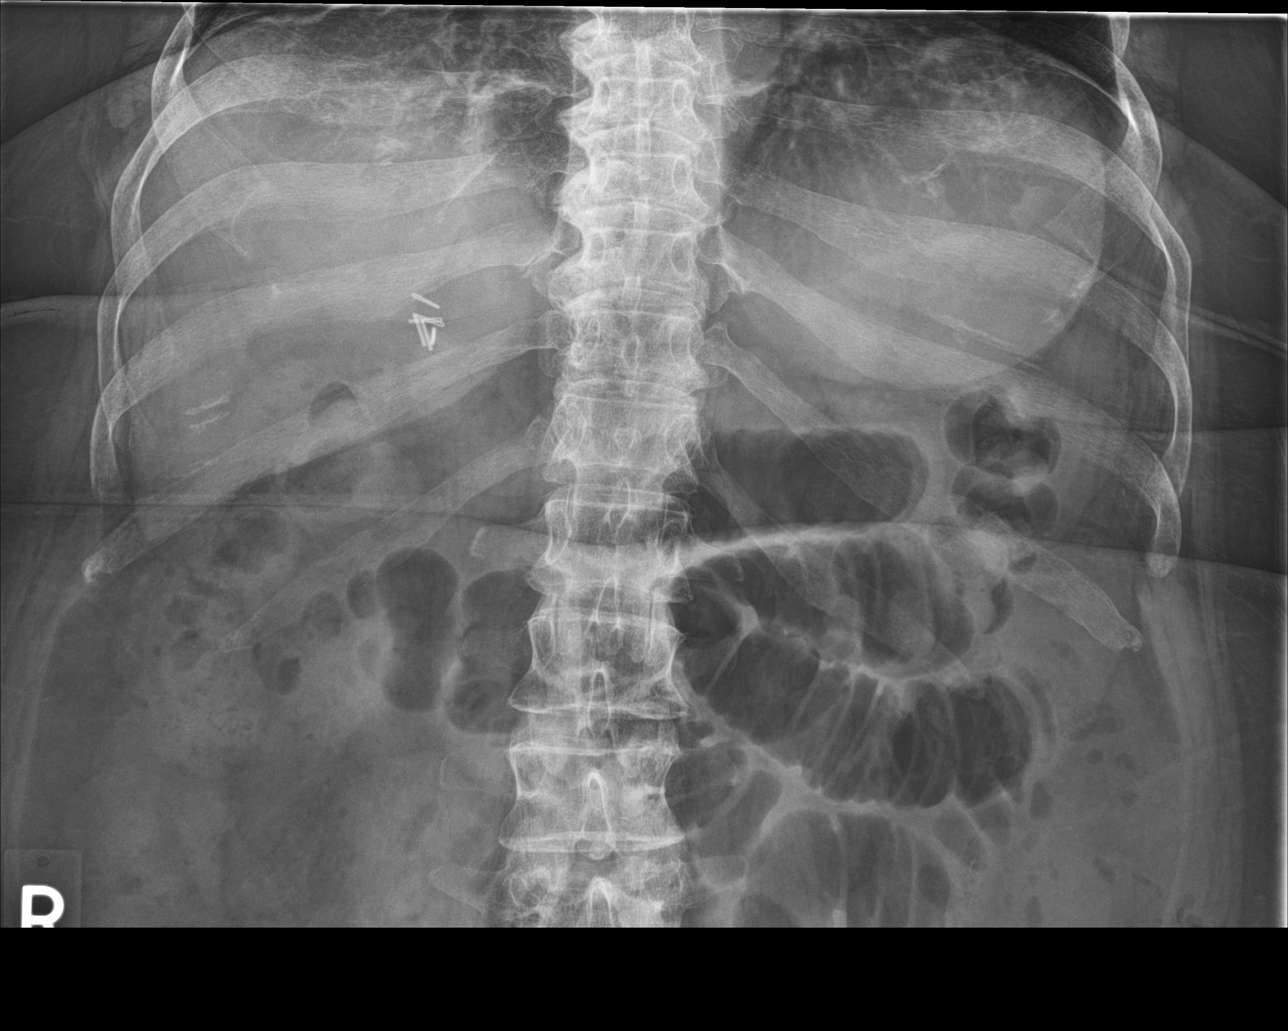

[abdomen supine (2 of 2)]
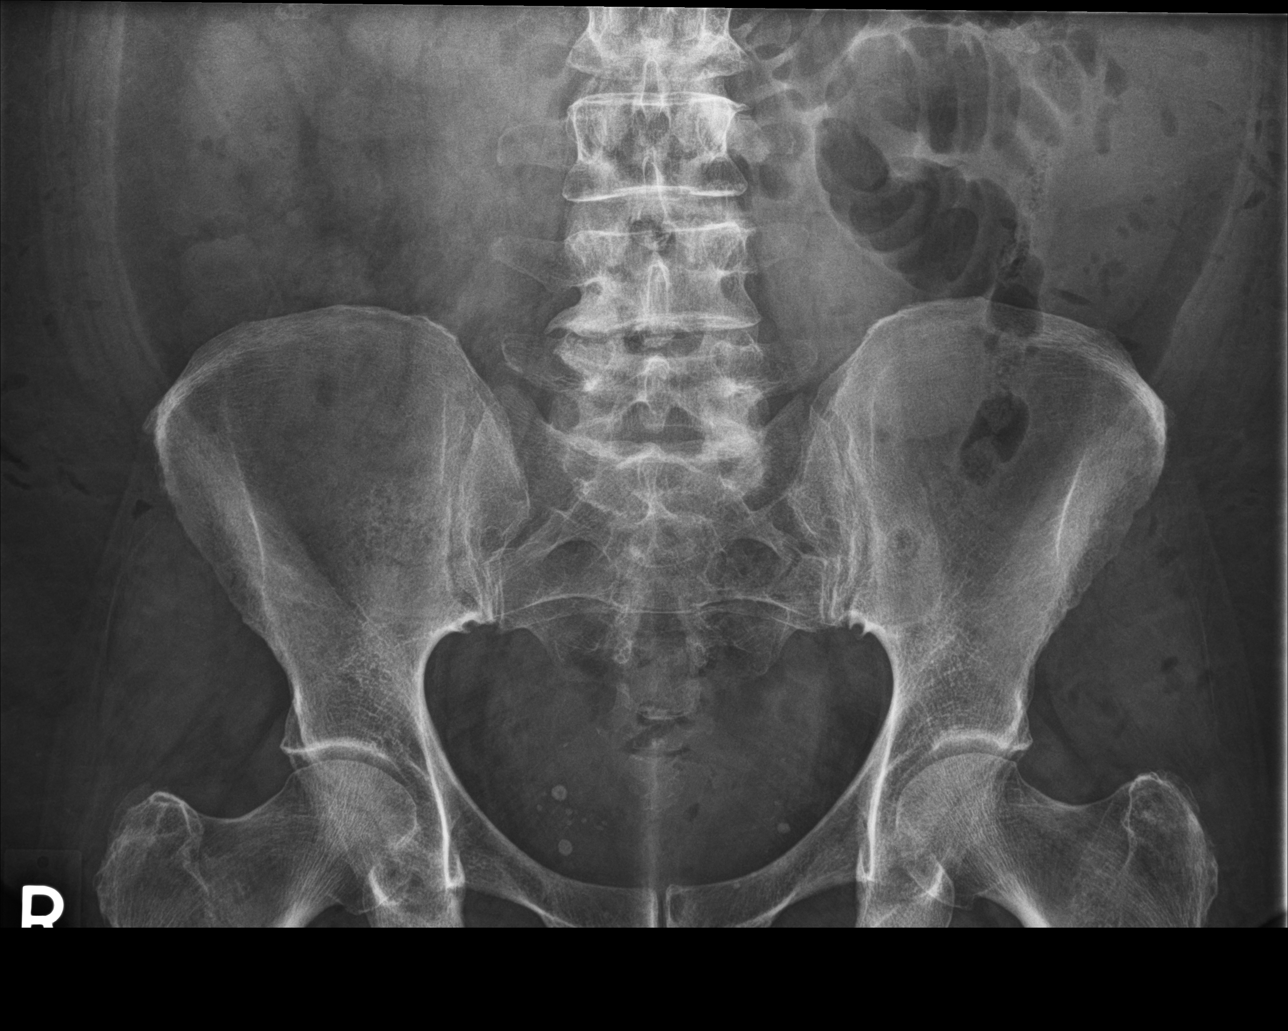

[abdomen erect (2 of 2)]
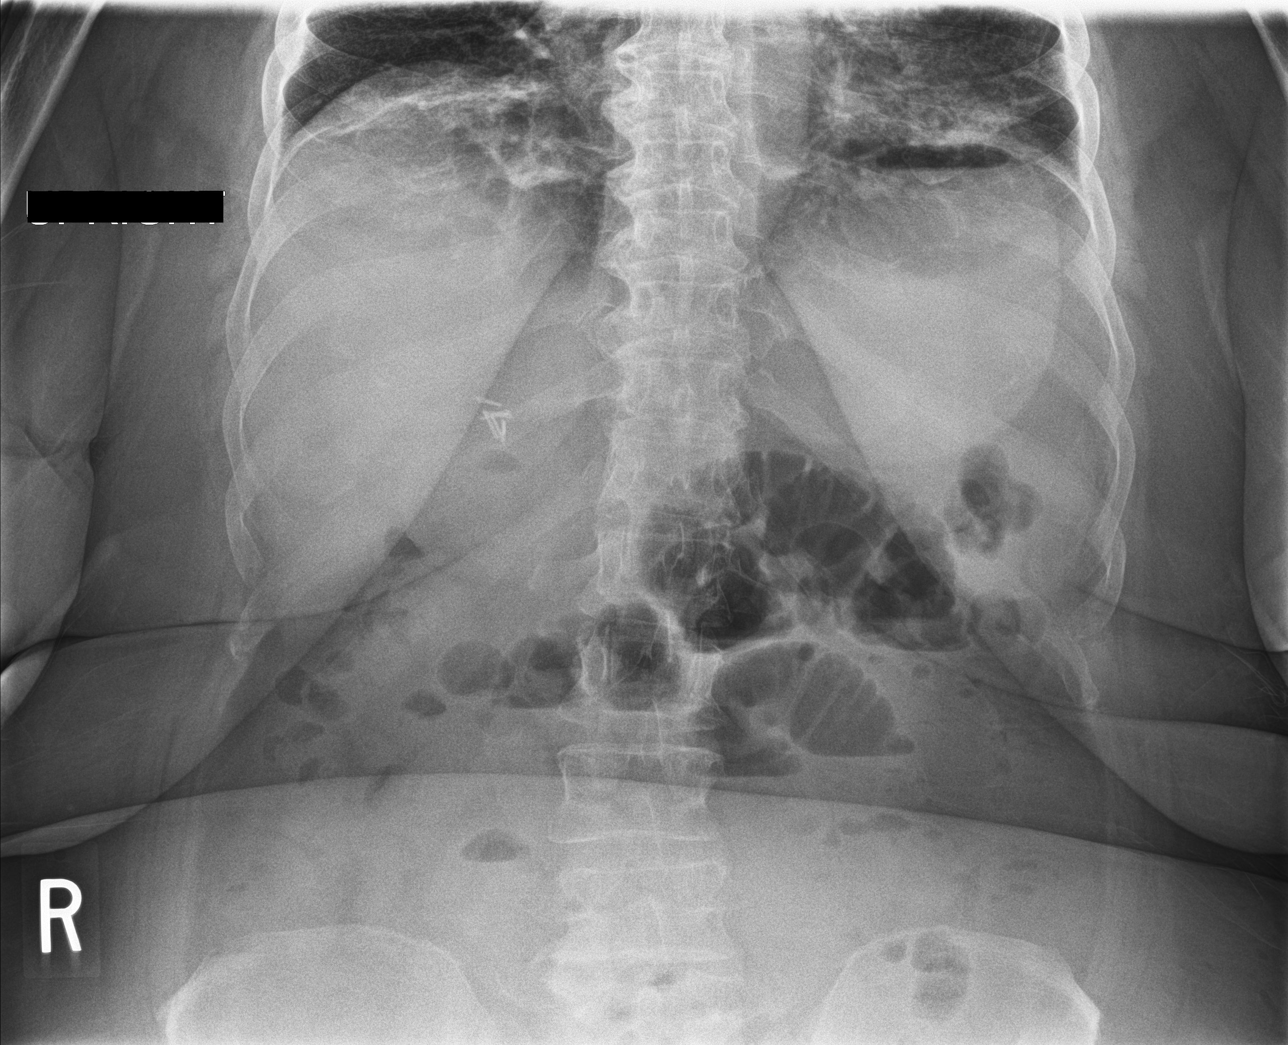

[4 of 4 positions shown; findings below may reference images not displayed]

FINDINGS: Upright film shows small volume gas in the stomach. Stomach is
distended with fluid. Small bowel loops measuring up to 3.3 cm in
diameter demonstrate air-fluid levels. Supine film shows gas
distended small bowel in the left abdomen showing up to 4.1 cm.
Small volume gas is visible in the transverse colon. Spine films
also show gas loculation in the abdominal wall, presumably secondary
to the recent surgery.
IMPRESSION: Moderately distended small bowel loops in the left abdomen with
air-fluid levels. Stomach is distended and fluid-filled. Given that
the patient is postop day 3, imaging features likely relate to
postoperative or adynamic ileus. Serial x-rays could be used to
assess for progression.

## 2020-04-23 ENCOUNTER — Encounter: Payer: Self-pay | Admitting: Orthopaedic Surgery

## 2020-04-23 ENCOUNTER — Ambulatory Visit (INDEPENDENT_AMBULATORY_CARE_PROVIDER_SITE_OTHER): Payer: 59 | Admitting: Orthopaedic Surgery

## 2020-04-23 ENCOUNTER — Other Ambulatory Visit: Payer: Self-pay

## 2020-04-23 VITALS — Ht 63.5 in | Wt 303.2 lb

## 2020-04-23 DIAGNOSIS — G5601 Carpal tunnel syndrome, right upper limb: Secondary | ICD-10-CM | POA: Diagnosis not present

## 2020-04-23 NOTE — Progress Notes (Signed)
Office Visit Note   Patient: Tina Valdez           Date of Birth: 1956/07/07           MRN: [id number] Visit Date: 04/23/2020              Requested by: [place], Betty G, MD [address] [place],  [place] [address] PCP: [place], Betty G, MD   Assessment & Plan: Visit Diagnoses:  1. Right carpal tunnel syndrome     Plan: Impression is a right carpal tunnel syndrome.  Based on findings patient would like to move forward with carpal tunnel release.  I do not feel that we need nerve conduction study is her symptoms are exactly the same as her left hand previously.  We will schedule her for the near future.  Risk benefits rehab recovery again reviewed with the patient today.  Follow-Up Instructions: Return for 1 week postop visit.   Orders:  No orders of the defined types were placed in this encounter.  No orders of the defined types were placed in this encounter.     Procedures: No procedures performed   Clinical Data: No additional findings.   Subjective: Chief Complaint  Patient presents with  . Right Hand - Pain    Tina Valdez is a 64 year old female comes in for evaluation of worsening right carpal tunnel syndrome.  She is left-hand dominant.  She is status post left carpal tunnel release in 2009 with Dr. Kathaleen Grinder from which she did well.  She has noticed that this is getting worse with her right hand with increasing burning numbness and tingling.  She has not had any previous injections but she is not interested in any more conservative treatment.  She has been wearing a carpal tunnel splint.   Review of Systems  Constitutional: Negative.   HENT: Negative.   Eyes: Negative.   Respiratory: Negative.   Cardiovascular: Negative.   Endocrine: Negative.   Musculoskeletal: Negative.   Neurological: Negative.   Hematological: Negative.   Psychiatric/Behavioral: Negative.   All other systems reviewed and are negative.    Objective: Vital Signs: Ht 5' 3.5"  (1.613 m)   Wt (!) 303 lb 3.2 oz (137.5 kg)   BMI 52.87 kg/m   Physical Exam Vitals and nursing note reviewed.  Constitutional:      Appearance: She is well-developed.  HENT:     Head: Normocephalic and atraumatic.  Pulmonary:     Effort: Pulmonary effort is normal.  Abdominal:     Palpations: Abdomen is soft.  Musculoskeletal:     Cervical back: Neck supple.  Skin:    General: Skin is warm.     Capillary Refill: Capillary refill takes less than 2 seconds.  Neurological:     Mental Status: She is alert and oriented to person, place, and time.  Psychiatric:        Behavior: Behavior normal.        Thought Content: Thought content normal.        Judgment: Judgment normal.     Ortho Exam Right hand shows mild thenar atrophy.  Positive carpal tunnel compressive signs. Specialty Comments:  No specialty comments available.  Imaging: No results found.   PMFS History: Patient Active Problem List   Diagnosis Date Noted  . Postmenopausal bleeding 10/21/2015   Past Medical History:  Diagnosis Date  . Medical history non-contributory     History reviewed. No pertinent family history.  Past Surgical History:  Procedure Laterality  Date  . ANKLE SURGERY    . BILATERAL SALPINGECTOMY Bilateral 10/21/2015   Procedure: BILATERAL SALPINGECTOMY;  Surgeon: Myna Hidalgo, DO;  Location: [facility];  Service: Gynecology;  Laterality: Bilateral;  . CARPAL TUNNEL RELEASE     Left wrist  . CESAREAN SECTION    . CHOLECYSTECTOMY    . DILATION AND CURETTAGE OF UTERUS    . LAPAROSCOPIC VAGINAL HYSTERECTOMY WITH SALPINGECTOMY Bilateral 10/21/2015   Procedure: LAPAROSCOPIC ASSISTED VAGINAL HYSTERECTOMY ;  Surgeon: Myna Hidalgo, DO;  Location: [facility];  Service: Gynecology;  Laterality: Bilateral;  . PILONIDAL CYST EXCISION     Social History   Occupational History  . Not on file  Tobacco Use  . Smoking status: Never Smoker  . Smokeless tobacco: Never Used  Substance and Sexual  Activity  . Alcohol use: No  . Drug use: No  . Sexual activity: Yes    Birth control/protection: Post-menopausal

## 2020-04-24 ENCOUNTER — Other Ambulatory Visit: Payer: Self-pay | Admitting: Family

## 2020-04-25 ENCOUNTER — Other Ambulatory Visit (HOSPITAL_COMMUNITY)
Admission: RE | Admit: 2020-04-25 | Discharge: 2020-04-25 | Disposition: A | Discharge status: Home / Self Care | Payer: 59 | Source: Ambulatory Visit | Attending: Orthopaedic Surgery | Admitting: Orthopaedic Surgery

## 2020-04-25 DIAGNOSIS — Z20822 Contact with and (suspected) exposure to covid-19: Secondary | ICD-10-CM | POA: Insufficient documentation

## 2020-04-25 LAB — SARS CORONAVIRUS 2 (TAT 6-24 HRS): SARS Coronavirus 2: NEGATIVE

## 2020-04-28 ENCOUNTER — Encounter (HOSPITAL_COMMUNITY): Payer: Self-pay | Admitting: Orthopaedic Surgery

## 2020-04-28 ENCOUNTER — Other Ambulatory Visit: Payer: Self-pay

## 2020-04-28 DIAGNOSIS — G5601 Carpal tunnel syndrome, right upper limb: Secondary | ICD-10-CM

## 2020-04-28 MED ORDER — DEXTROSE 5 % IV SOLN
3.0000 g | INTRAVENOUS | Status: AC
  Administered 2020-04-29: 3 g via INTRAVENOUS
  Filled 2020-04-28: qty 3

## 2020-04-28 NOTE — Discharge Instructions (Signed)
   Postoperative instructions:  Weightbearing instructions: no lifting more than 10 lbs.  Dressing instructions: Keep your dressing and/or splint clean and dry at all times.  It will be removed at your first post-operative appointment.  Your stitches and/or staples will be removed at this visit.  Incision instructions:  Do not soak your incision for 3 weeks after surgery.  If the incision gets wet, pat dry and do not scrub the incision.  Pain control:  You have been given a prescription to be taken as directed for post-operative pain control.  In addition, elevate the operative extremity above the heart at all times to prevent swelling and throbbing pain.  Take over-the-counter Colace, 100mg  by mouth twice a day while taking narcotic pain medications to help prevent constipation.  Follow up appointments: 1) 7 days for wound check.   -------------------------------------------------------------------------------------------------------------  After Surgery Pain Control:  After your surgery, post-surgical discomfort or pain is likely. This discomfort can last several days to a few weeks. At certain times of the day your discomfort may be more intense.  Did you receive a nerve block?  A nerve block can provide pain relief for one hour to two days after your surgery. As long as the nerve block is working, you will experience little or no sensation in the area the surgeon operated on.  As the nerve block wears off, you will begin to experience pain or discomfort. It is very important that you begin taking your prescribed pain medication before the nerve block fully wears off. Treating your pain at the first sign of the block wearing off will ensure your pain is better controlled and more tolerable when full-sensation returns. Do not wait until the pain is intolerable, as the medicine will be less effective. It is better to treat pain in advance than to try and catch up.  General Anesthesia:  If  you did not receive a nerve block during your surgery, you will need to start taking your pain medication shortly after your surgery and should continue to do so as prescribed by your surgeon.  Pain Medication:  Most commonly we prescribe Vicodin and Percocet for post-operative pain. Both of these medications contain a combination of acetaminophen (Tylenol) and a narcotic to help control pain.   It takes between 30 and 45 minutes before pain medication starts to work. It is important to take your medication before your pain level gets too intense.   Nausea is a common side effect of many pain medications. You will want to eat something before taking your pain medicine to help prevent nausea.   If you are taking a prescription pain medication that contains acetaminophen, we recommend that you do not take additional over the counter acetaminophen (Tylenol).  Other pain relieving options:   Using a cold pack to ice the affected area a few times a day (15 to 20 minutes at a time) can help to relieve pain, reduce swelling and bruising.   Elevation of the affected area can also help to reduce pain and swelling.

## 2020-04-28 NOTE — Progress Notes (Signed)
Spoke with pt for pre-op call. Pt denies HTN, cardiac disease or Diabetes.  Covid test done on 04/25/20 and it's negative. Pt states she has been in quarantine since the test was done and understands that she stays in quarantine until she comes to the hospital tomorrow.  ERAS protocol ordered. Instructed pt not to eat food after midnight tonight. She may have clear liquids until 9:45 AM (reviewed with pt what are considered clear liquids). She will come by today to pick up the Pre-Surgery Ensure to drink in the AM between 9:30 and 9:45 AM. This will be the last liquids she will have prior to surgery. Pt voiced understanding.

## 2020-04-29 ENCOUNTER — Ambulatory Visit (HOSPITAL_COMMUNITY): Payer: 59

## 2020-04-29 ENCOUNTER — Other Ambulatory Visit: Payer: Self-pay

## 2020-04-29 ENCOUNTER — Encounter (HOSPITAL_COMMUNITY)
Admission: RE | Disposition: A | Discharge status: Home / Self Care | Payer: Self-pay | Source: Home / Self Care | Attending: Orthopaedic Surgery

## 2020-04-29 ENCOUNTER — Encounter (HOSPITAL_COMMUNITY): Payer: Self-pay | Admitting: Orthopaedic Surgery

## 2020-04-29 ENCOUNTER — Ambulatory Visit (HOSPITAL_COMMUNITY)
Admission: RE | Admit: 2020-04-29 | Discharge: 2020-04-29 | Disposition: A | Discharge status: Home / Self Care | Payer: 59 | Attending: Orthopaedic Surgery | Admitting: Orthopaedic Surgery

## 2020-04-29 DIAGNOSIS — G5601 Carpal tunnel syndrome, right upper limb: Secondary | ICD-10-CM | POA: Diagnosis present

## 2020-04-29 DIAGNOSIS — Z6841 Body Mass Index (BMI) 40.0 and over, adult: Secondary | ICD-10-CM | POA: Insufficient documentation

## 2020-04-29 DIAGNOSIS — Z20822 Contact with and (suspected) exposure to covid-19: Secondary | ICD-10-CM | POA: Diagnosis not present

## 2020-04-29 HISTORY — PX: CARPAL TUNNEL RELEASE: SHX101

## 2020-04-29 HISTORY — DX: Anemia, unspecified: D64.9

## 2020-04-29 HISTORY — DX: Pneumonia, unspecified organism: J18.9

## 2020-04-29 LAB — CBC
HCT: 46.2 % — ABNORMAL HIGH (ref 36.0–46.0)
Hemoglobin: 14.4 g/dL (ref 12.0–15.0)
MCH: 28 pg (ref 26.0–34.0)
MCHC: 31.2 g/dL (ref 30.0–36.0)
MCV: 89.9 fL (ref 80.0–100.0)
Platelets: 372 10*3/uL (ref 150–400)
RBC: 5.14 MIL/uL — ABNORMAL HIGH (ref 3.87–5.11)
RDW: 14.2 % (ref 11.5–15.5)
WBC: 7.6 10*3/uL (ref 4.0–10.5)
nRBC: 0 % (ref 0.0–0.2)

## 2020-04-29 SURGERY — CARPAL TUNNEL RELEASE
Anesthesia: Monitor Anesthesia Care | Site: Wrist | Laterality: Right

## 2020-04-29 MED ORDER — ACETAMINOPHEN 500 MG PO TABS: 1000.0000 mg | ORAL_TABLET | Freq: Once | ORAL | Status: DC

## 2020-04-29 MED ORDER — MIDAZOLAM HCL 2 MG/2ML IJ SOLN
INTRAMUSCULAR | Status: AC
  Filled 2020-04-29: qty 2

## 2020-04-29 MED ORDER — PROPOFOL 10 MG/ML IV BOLUS
INTRAVENOUS | Status: DC | PRN
  Administered 2020-04-29: 10 mg via INTRAVENOUS
  Administered 2020-04-29: 25 mg via INTRAVENOUS

## 2020-04-29 MED ORDER — ORAL CARE MOUTH RINSE: 15.0000 mL | Freq: Once | OROMUCOSAL | Status: AC

## 2020-04-29 MED ORDER — FENTANYL CITRATE (PF) 250 MCG/5ML IJ SOLN
INTRAMUSCULAR | Status: DC | PRN
  Administered 2020-04-29 (×2): 25 ug via INTRAVENOUS

## 2020-04-29 MED ORDER — FENTANYL CITRATE (PF) 250 MCG/5ML IJ SOLN
INTRAMUSCULAR | Status: AC
  Filled 2020-04-29: qty 5

## 2020-04-29 MED ORDER — BUPIVACAINE HCL (PF) 0.25 % IJ SOLN
INTRAMUSCULAR | Status: AC
  Filled 2020-04-29: qty 30

## 2020-04-29 MED ORDER — MIDAZOLAM HCL 2 MG/2ML IJ SOLN
INTRAMUSCULAR | Status: DC | PRN
  Administered 2020-04-29: 2 mg via INTRAVENOUS

## 2020-04-29 MED ORDER — BUPIVACAINE HCL (PF) 0.25 % IJ SOLN
INTRAMUSCULAR | Status: DC | PRN
  Administered 2020-04-29: 10 mL

## 2020-04-29 MED ORDER — PROPOFOL 10 MG/ML IV BOLUS
INTRAVENOUS | Status: AC
  Filled 2020-04-29: qty 20

## 2020-04-29 MED ORDER — PROPOFOL 500 MG/50ML IV EMUL
INTRAVENOUS | Status: DC | PRN
  Administered 2020-04-29: 75 ug/kg/min via INTRAVENOUS

## 2020-04-29 MED ORDER — LIDOCAINE HCL (PF) 0.5 % IJ SOLN
INTRAMUSCULAR | Status: DC | PRN
  Administered 2020-04-29: 25 mL via INTRAVENOUS

## 2020-04-29 MED ORDER — HYDROCODONE-ACETAMINOPHEN 5-325 MG PO TABS: 1.0000 | ORAL_TABLET | Freq: Three times a day (TID) | ORAL | 0 refills | Status: DC | PRN

## 2020-04-29 MED ORDER — CHLORHEXIDINE GLUCONATE 0.12 % MT SOLN
15.0000 mL | Freq: Once | OROMUCOSAL | Status: AC
  Administered 2020-04-29: 15 mL via OROMUCOSAL
  Filled 2020-04-29: qty 15

## 2020-04-29 MED ORDER — SODIUM CHLORIDE 0.9 % IR SOLN
Status: DC | PRN
  Administered 2020-04-29: 1

## 2020-04-29 MED ORDER — LACTATED RINGERS IV SOLN: INTRAVENOUS | Status: DC

## 2020-04-29 SURGICAL SUPPLY — 26 items
BNDG CMPR 9X4 STRL LF SNTH (GAUZE/BANDAGES/DRESSINGS) ×1
BNDG ELASTIC 4X5.8 VLCR STR LF (GAUZE/BANDAGES/DRESSINGS) ×2 IMPLANT
BNDG ESMARK 4X9 LF (GAUZE/BANDAGES/DRESSINGS) ×3 IMPLANT
BNDG GAUZE ELAST 4 BULKY (GAUZE/BANDAGES/DRESSINGS) ×2 IMPLANT
CORD BIPOLAR FORCEPS 12FT (ELECTRODE) ×2 IMPLANT
COVER SURGICAL LIGHT HANDLE (MISCELLANEOUS) ×3 IMPLANT
CUFF TOURN SGL QUICK 18X4 (TOURNIQUET CUFF) ×2 IMPLANT
DRSG XEROFORM 1X8 (GAUZE/BANDAGES/DRESSINGS) ×2 IMPLANT
GAUZE SPONGE 4X4 12PLY STRL (GAUZE/BANDAGES/DRESSINGS) ×3 IMPLANT
GAUZE XEROFORM 1X8 LF (GAUZE/BANDAGES/DRESSINGS) ×3 IMPLANT
GLOVE BIOGEL PI IND STRL 7.0 (GLOVE) ×2 IMPLANT
GLOVE BIOGEL PI INDICATOR 7.0 (GLOVE) ×4
GLOVE ECLIPSE 7.0 STRL STRAW (GLOVE) ×6 IMPLANT
GLOVE SKINSENSE NS SZ7.5 (GLOVE) ×4
GLOVE SKINSENSE STRL SZ7.5 (GLOVE) ×2 IMPLANT
GOWN STRL REIN XL XLG (GOWN DISPOSABLE) ×6 IMPLANT
GOWN STRL REUS W/ TWL LRG LVL3 (GOWN DISPOSABLE) ×1 IMPLANT
GOWN STRL REUS W/TWL LRG LVL3 (GOWN DISPOSABLE) ×3
KIT BASIN OR (CUSTOM PROCEDURE TRAY) ×3 IMPLANT
KIT TURNOVER KIT B (KITS) ×3 IMPLANT
NS IRRIG 1000ML POUR BTL (IV SOLUTION) ×6 IMPLANT
PACK ORTHO EXTREMITY (CUSTOM PROCEDURE TRAY) ×3 IMPLANT
SUT ETHILON 4 0 PS 2 18 (SUTURE) ×3 IMPLANT
TOWEL GREEN STERILE (TOWEL DISPOSABLE) ×3 IMPLANT
TOWEL GREEN STERILE FF (TOWEL DISPOSABLE) ×3 IMPLANT
UNDERPAD 30X36 HEAVY ABSORB (UNDERPADS AND DIAPERS) ×6 IMPLANT

## 2020-04-29 NOTE — Progress Notes (Signed)
Orthopedic Tech Progress Note Patient Details:  Tina Valdez 07-25-1956 [id number]  Ortho Devices Type of Ortho Device: Sling immobilizer Ortho Device/Splint Location: Right upper extremity Ortho Device/Splint Interventions: Ordered, Application, Adjustment   Post Interventions Patient Tolerated: Well Instructions Provided: Adjustment of device, Care of device   Wandalee Klang Clarene Reamer 04/29/2020, 3:33 PM

## 2020-04-29 NOTE — Op Note (Signed)
   Carpal tunnel op note  DATE OF SURGERY:04/29/2020  PREOPERATIVE DIAGNOSIS:  Right carpal tunnel syndrome  POSTOPERATIVE DIAGNOSIS: same  PROCEDURE:  Right carpal tunnel release. CPT [phone]  SURGEON: Surgeon(s): Tarry Kos, MD  ASSIST: None  ANESTHESIA:  Regional  TOURNIQUET TIME: less than 20 minutes  BLOOD LOSS: Minimal.  COMPLICATIONS: None.  PATHOLOGY: None.  INDICATIONS: The patient is a 64 y.o. -year-old female who presented with carpal tunnel syndrome failing nonsurgical management, indicated for surgical release.  DESCRIPTION OF PROCEDURE: The patient was identified in the preoperative holding area.  The operative site was marked by the surgeon and confirmed by the patient.  He was brought back to the operating room.  Anesthesia was induced by the anesthesia team.  A well padded nonsterile tourniquet was placed. The operative extremity was prepped and draped in standard sterile fashion.  A timeout was performed.  Preoperative antibiotics were given.   A palmar incision was made about 5 mm ulnar to the thenar crease.  The palmar aponeurosis was exposed and divided in line with the skin incision. The palmaris brevis was visualized and divided.  The distal edge of the transcarpal ligament was identified. A hemostat was inserted into the carpal tunnel to protect the median nerve and the flexor tendons. Then, the transverse carpal ligament was released under direct visualization. Proximally, a subcutaneous tunnel was made allowing a Sewell retractor to be placed. Then, the distal portion of the antebrachial fascia was released. Distally, all fibrous bands were released. The median nerve was visualized, and the fat pad was exposed. Following release, local infiltration with 0.25% of Sensorcaine was given. The tourniquet was deflated. Hemostasis achieved.  Wound was irrigated and closed with 4-0 nylon sutures. Sterile dressing applied. The patient was transferred to the recovery room  in stable condition after all counts were correct.  POSTOPERATIVE PLAN: To start nerve gliding exercises as tolerated and no heavy lifting for four weeks.  Glee Arvin, M.D. [facility] 1:14 PM

## 2020-04-29 NOTE — Anesthesia Preprocedure Evaluation (Signed)
Anesthesia Evaluation  Patient identified by MRN, date of birth, ID band Patient awake    Reviewed: Allergy & Precautions, NPO status , Patient's Chart, lab work & pertinent test results  Airway Mallampati: II  TM Distance: >3 FB Neck ROM: Full    Dental  (+) Dental Advisory Given   Pulmonary neg pulmonary ROS,    breath sounds clear to auscultation       Cardiovascular negative cardio ROS   Rhythm:Regular Rate:Normal     Neuro/Psych  Neuromuscular disease    GI/Hepatic negative GI ROS, Neg liver ROS,   Endo/Other  Morbid obesity  Renal/GU negative Renal ROS     Musculoskeletal   Abdominal   Peds  Hematology  (+) anemia ,   Anesthesia Other Findings   Reproductive/Obstetrics                             Anesthesia Physical Anesthesia Plan  ASA: III  Anesthesia Plan: Bier Block and MAC and Bier Block-LIDOCAINE ONLY   Post-op Pain Management:    Induction:   PONV Risk Score and Plan: 2 and Propofol infusion, Ondansetron and Treatment may vary due to age or medical condition  Airway Management Planned: Simple Face Mask and Natural Airway  Additional Equipment: None  Intra-op Plan:   Post-operative Plan:   Informed Consent: I have reviewed the patients History and Physical, chart, labs and discussed the procedure including the risks, benefits and alternatives for the proposed anesthesia with the patient or authorized representative who has indicated his/her understanding and acceptance.       Plan Discussed with:   Anesthesia Plan Comments:         Anesthesia Quick Evaluation

## 2020-04-29 NOTE — H&P (Signed)
PREOPERATIVE H&P  Chief Complaint: right carpal tunnel syndrome  HPI: Tina Valdez is a 64 y.o. female who presents for surgical treatment of right carpal tunnel syndrome.  She denies any changes in medical history.  Past Medical History:  Diagnosis Date  . Anemia    vitamin D deficiency "years ago)  . Medical history non-contributory   . Pneumonia    Past Surgical History:  Procedure Laterality Date  . ANKLE SURGERY    . BILATERAL SALPINGECTOMY Bilateral 10/21/2015   Procedure: BILATERAL SALPINGECTOMY;  Surgeon: Myna Hidalgo, DO;  Location: [facility];  Service: Gynecology;  Laterality: Bilateral;  . CARPAL TUNNEL RELEASE     Left wrist  . CESAREAN SECTION    . CHOLECYSTECTOMY    . COLONOSCOPY    . DILATION AND CURETTAGE OF UTERUS    . LAPAROSCOPIC VAGINAL HYSTERECTOMY WITH SALPINGECTOMY Bilateral 10/21/2015   Procedure: LAPAROSCOPIC ASSISTED VAGINAL HYSTERECTOMY ;  Surgeon: Myna Hidalgo, DO;  Location: [facility];  Service: Gynecology;  Laterality: Bilateral;  . PILONIDAL CYST EXCISION     Social History   Socioeconomic History  . Marital status: Married    Spouse name: Not on file  . Number of children: Not on file  . Years of education: Not on file  . Highest education level: Not on file  Occupational History  . Not on file  Tobacco Use  . Smoking status: Never Smoker  . Smokeless tobacco: Never Used  Substance and Sexual Activity  . Alcohol use: No  . Drug use: No  . Sexual activity: Yes    Birth control/protection: Post-menopausal  Other Topics Concern  . Not on file  Social History Narrative  . Not on file   Social Determinants of Health   Financial Resource Strain:   . Difficulty of Paying Living Expenses:   Food Insecurity:   . Worried About Programme researcher, broadcasting/film/video in the Last Year:   . Barista in the Last Year:   Transportation Needs:   . Freight forwarder (Medical):   Marland Kitchen Lack of Transportation (Non-Medical):   Physical Activity:   .  Days of Exercise per Week:   . Minutes of Exercise per Session:   Stress:   . Feeling of Stress :   Social Connections:   . Frequency of Communication with Friends and Family:   . Frequency of Social Gatherings with Friends and Family:   . Attends Religious Services:   . Active Member of Clubs or Organizations:   . Attends Banker Meetings:   Marland Kitchen Marital Status:    History reviewed. No pertinent family history. Allergies  Allergen Reactions  . Bee Venom Swelling   Prior to Admission medications   Medication Sig Start Date End Date Taking? Authorizing Provider  docusate sodium (COLACE) 100 MG capsule Take 1 capsule (100 mg total) by mouth 2 (two) times daily. Patient not taking: Reported on 04/23/2020 10/22/15   Myna Hidalgo, DO  HYDROcodone-acetaminophen (NORCO/VICODIN) 5-325 MG tablet Take 1-2 tablets by mouth every 6 (six) hours as needed for moderate pain. Patient not taking: Reported on 04/23/2020 10/22/15   Myna Hidalgo, DO  ibuprofen (ADVIL,MOTRIN) 600 MG tablet Take 1 tablet (600 mg total) by mouth every 6 (six) hours as needed for mild pain. Patient not taking: Reported on 04/23/2020 10/22/15   Myna Hidalgo, DO  ondansetron (ZOFRAN) 4 MG tablet Take 1 tablet (4 mg total) by mouth every 6 (six) hours as needed for nausea or vomiting.  Patient not taking: Reported on 04/23/2020 10/24/15   Hoover Browns, MD  Polyethylene Glycol 3350 (MIRALAX PO) Take 1 Dose by mouth daily as needed. Patient not taking: Reported on 04/23/2020    [provider]  sulfamethoxazole-trimethoprim (BACTRIM DS,SEPTRA DS) 800-160 MG tablet Take 1 tablet by mouth every 12 (twelve) hours. Patient not taking: Reported on 04/23/2020 10/24/15   Hoover Browns, MD     Positive ROS: All other systems have been reviewed and were otherwise negative with the exception of those mentioned in the HPI and as above.  Physical Exam: General: Alert, no acute distress Cardiovascular: No pedal  edema Respiratory: No cyanosis, no use of accessory musculature GI: abdomen soft Skin: No lesions in the area of chief complaint Neurologic: Sensation intact distally Psychiatric: Patient is competent for consent with normal mood and affect Lymphatic: no lymphedema  MUSCULOSKELETAL: exam stable  Assessment: right carpal tunnel syndrome  Plan: Plan for Procedure(s): RIGHT CARPAL TUNNEL RELEASE  The risks benefits and alternatives were discussed with the patient including but not limited to the risks of nonoperative treatment, versus surgical intervention including infection, bleeding, nerve injury,  blood clots, cardiopulmonary complications, morbidity, mortality, among others, and they were willing to proceed.   Preoperative templating of the joint replacement has been completed, documented, and submitted to the Operating Room personnel in order to optimize intra-operative equipment management.   Glee Arvin, MD 04/29/2020 7:23 AM

## 2020-04-29 NOTE — Transfer of Care (Signed)
Immediate Anesthesia Transfer of Care Note  Patient: Tina Valdez  Procedure(s) Performed: RIGHT CARPAL TUNNEL RELEASE (Right Wrist)  Patient Location: PACU  Anesthesia Type:MAC combined with regional for post-op pain  Level of Consciousness: awake, alert  and patient cooperative  Airway & Oxygen Therapy: Patient Spontanous Breathing  Post-op Assessment: Report given to RN and Post -op Vital signs reviewed and stable  Post vital signs: Reviewed and stable  Last Vitals:  Vitals Value Taken Time  BP    Temp    Pulse 76 04/29/20 1412  Resp 20 04/29/20 1412  SpO2 95 % 04/29/20 1412  Vitals shown include unvalidated device data.  Last Pain:  Vitals:   04/29/20 1034  TempSrc:   PainSc: 0-No pain      Patients Stated Pain Goal: 0 (30/13/14 3888)  Complications: No complications documented.

## 2020-04-29 NOTE — Anesthesia Procedure Notes (Addendum)
Anesthesia Regional Block: Bier block (IV Regional)   Pre-Anesthetic Checklist: ,, timeout performed, Correct Patient, Correct Site, Correct Laterality, Correct Procedure, Correct Position, risks and benefits discussed, surgical consent, pre-op evaluation,  At surgeon's request and post-op pain management  Laterality: Right     Needles:  Injection technique: Catheter      Needle Gauge: 20     Additional Needles:   Procedures:,,,,,,,, #20gu IV placed  Narrative:  End time: 04/29/2020 1:44 PM  Events:,, positive IV test,,,,,,,,  Performed by: Other  Anesthesiologist: Marcene Duos, MD CRNA: Drema Pry, CRNA  Additional Notes: Beir Block placed by L. Ferm, SRNA

## 2020-04-29 NOTE — Anesthesia Postprocedure Evaluation (Signed)
Anesthesia Post Note  Patient: Tina Valdez  Procedure(s) Performed: RIGHT CARPAL TUNNEL RELEASE (Right Wrist)     Patient location during evaluation: PACU Anesthesia Type: MAC Level of consciousness: awake and alert Pain management: pain level controlled Vital Signs Assessment: post-procedure vital signs reviewed and stable Respiratory status: spontaneous breathing, nonlabored ventilation, respiratory function stable and patient connected to nasal cannula oxygen Cardiovascular status: stable and blood pressure returned to baseline Postop Assessment: no apparent nausea or vomiting Anesthetic complications: no   No complications documented.  Last Vitals:  Vitals:   04/29/20 1415 04/29/20 1430  BP: 109/73 126/82  Pulse: 78 65  Resp: (!) 26 (!) 21  Temp: 36.6 C 36.6 C  SpO2: 96% 99%    Last Pain:  Vitals:   04/29/20 1430  TempSrc:   PainSc: 0-No pain                 Corita Allinson DAVID

## 2020-04-30 ENCOUNTER — Encounter (HOSPITAL_COMMUNITY): Payer: Self-pay | Admitting: Orthopaedic Surgery

## 2020-05-06 ENCOUNTER — Ambulatory Visit (INDEPENDENT_AMBULATORY_CARE_PROVIDER_SITE_OTHER): Payer: 59 | Admitting: Physician Assistant

## 2020-05-06 ENCOUNTER — Encounter: Payer: Self-pay | Admitting: Orthopaedic Surgery

## 2020-05-06 ENCOUNTER — Other Ambulatory Visit: Payer: Self-pay

## 2020-05-06 DIAGNOSIS — Z9889 Other specified postprocedural states: Secondary | ICD-10-CM

## 2020-05-06 DIAGNOSIS — G5601 Carpal tunnel syndrome, right upper limb: Secondary | ICD-10-CM

## 2020-05-06 NOTE — Progress Notes (Signed)
   Post-Op Visit Note   Patient: Tina Valdez           Date of Birth: 1956/05/24           MRN: [id number] Visit Date: 05/06/2020 PCP: [place], Betty G, MD   Assessment & Plan:  Chief Complaint:  Chief Complaint  Patient presents with  . Right Wrist - Pain   Visit Diagnoses:  1. Carpal tunnel syndrome on right   2. S/P carpal tunnel release     Plan: Patient is a pleasant 64 year old female who comes in today 1 week out right carpal tunnel release.  She has been doing well.  She is no longer requiring any narcotic pain medication.  She denies any numbness, tingling or burning to the right hand or fingers.  Examination of her right hand reveals a well-healing surgical incision with nylon sutures in place.  No evidence of infection or cellulitis.  Today, we cleaned and covered the wound.  She will wear her removable Velcro splint over the next 1 to 2 weeks for comfort.  Follow-up with [place] in 1 week for probable suture removal.  No submerging the hand in water or lifting anything heavy for another 3 weeks.  Call with concerns or questions.  Follow-Up Instructions: Return in about 1 week (around 05/13/2020).   Orders:  No orders of the defined types were placed in this encounter.  No orders of the defined types were placed in this encounter.   Imaging: Imaging  PMFS History: Patient Active Problem List   Diagnosis Date Noted  . Carpal tunnel syndrome on right 04/28/2020  . Postmenopausal bleeding 10/21/2015   Past Medical History:  Diagnosis Date  . Anemia    vitamin D deficiency "years ago)  . Medical history non-contributory   . Pneumonia     History reviewed. No pertinent family history.  Past Surgical History:  Procedure Laterality Date  . ANKLE SURGERY    . BILATERAL SALPINGECTOMY Bilateral 10/21/2015   Procedure: BILATERAL SALPINGECTOMY;  Surgeon: Myna Hidalgo, DO;  Location: [facility];  Service: Gynecology;  Laterality: Bilateral;  . CARPAL TUNNEL RELEASE     Left  wrist  . CARPAL TUNNEL RELEASE Right 04/29/2020   Procedure: RIGHT CARPAL TUNNEL RELEASE;  Surgeon: Tarry Kos, MD;  Location: [facility];  Service: Orthopedics;  Laterality: Right;  . CESAREAN SECTION    . CHOLECYSTECTOMY    . COLONOSCOPY    . DILATION AND CURETTAGE OF UTERUS    . LAPAROSCOPIC VAGINAL HYSTERECTOMY WITH SALPINGECTOMY Bilateral 10/21/2015   Procedure: LAPAROSCOPIC ASSISTED VAGINAL HYSTERECTOMY ;  Surgeon: Myna Hidalgo, DO;  Location: [facility];  Service: Gynecology;  Laterality: Bilateral;  . PILONIDAL CYST EXCISION     Social History   Occupational History  . Not on file  Tobacco Use  . Smoking status: Never Smoker  . Smokeless tobacco: Never Used  Substance and Sexual Activity  . Alcohol use: No  . Drug use: No  . Sexual activity: Yes    Birth control/protection: Post-menopausal

## 2020-05-12 ENCOUNTER — Encounter: Payer: Self-pay | Admitting: Physician Assistant

## 2020-05-12 ENCOUNTER — Ambulatory Visit (INDEPENDENT_AMBULATORY_CARE_PROVIDER_SITE_OTHER): Payer: 59 | Admitting: Physician Assistant

## 2020-05-12 DIAGNOSIS — G5601 Carpal tunnel syndrome, right upper limb: Secondary | ICD-10-CM

## 2020-05-12 DIAGNOSIS — Z9889 Other specified postprocedural states: Secondary | ICD-10-CM

## 2020-05-12 NOTE — Progress Notes (Signed)
   Post-Op Visit Note   Patient: Tina Valdez           Date of Birth: 1955-12-14           MRN: [id number] Visit Date: 05/12/2020 PCP: [place], Betty G, MD   Assessment & Plan:  Chief Complaint:  Chief Complaint  Patient presents with  . Right Hand - Pain   Visit Diagnoses:  1. S/P carpal tunnel release   2. Carpal tunnel syndrome on right     Plan: Patient is a pleasant 64 year old female who comes in today 2 weeks out right carpal tunnel release 04/29/2020.  She has been doing well.  No pain.  No numbness, tingling or burning to the right hand.  Examination of her right hand reveals a well-healing surgical incision with nylon sutures in place.  No evidence of infection or cellulitis.  Fingers are warm and well-perfused.  Today, nylon sutures were removed and Steri-Strips applied.  No submerging her hand in water or heavy lifting for another 2 weeks.  She will follow-up with [place] in 4 weeks time for recheck.  Call with concerns or questions.  Follow-Up Instructions: Return in about 4 weeks (around 06/09/2020).   Orders:  No orders of the defined types were placed in this encounter.  No orders of the defined types were placed in this encounter.   Imaging: No new imaging  PMFS History: Patient Active Problem List   Diagnosis Date Noted  . Carpal tunnel syndrome on right 04/28/2020  . Postmenopausal bleeding 10/21/2015   Past Medical History:  Diagnosis Date  . Anemia    vitamin D deficiency "years ago)  . Medical history non-contributory   . Pneumonia     History reviewed. No pertinent family history.  Past Surgical History:  Procedure Laterality Date  . ANKLE SURGERY    . BILATERAL SALPINGECTOMY Bilateral 10/21/2015   Procedure: BILATERAL SALPINGECTOMY;  Surgeon: Myna Hidalgo, DO;  Location: [facility];  Service: Gynecology;  Laterality: Bilateral;  . CARPAL TUNNEL RELEASE     Left wrist  . CARPAL TUNNEL RELEASE Right 04/29/2020   Procedure: RIGHT CARPAL TUNNEL RELEASE;   Surgeon: Tarry Kos, MD;  Location: [facility];  Service: Orthopedics;  Laterality: Right;  . CESAREAN SECTION    . CHOLECYSTECTOMY    . COLONOSCOPY    . DILATION AND CURETTAGE OF UTERUS    . LAPAROSCOPIC VAGINAL HYSTERECTOMY WITH SALPINGECTOMY Bilateral 10/21/2015   Procedure: LAPAROSCOPIC ASSISTED VAGINAL HYSTERECTOMY ;  Surgeon: Myna Hidalgo, DO;  Location: [facility];  Service: Gynecology;  Laterality: Bilateral;  . PILONIDAL CYST EXCISION     Social History   Occupational History  . Not on file  Tobacco Use  . Smoking status: Never Smoker  . Smokeless tobacco: Never Used  Substance and Sexual Activity  . Alcohol use: No  . Drug use: No  . Sexual activity: Yes    Birth control/protection: Post-menopausal

## 2020-06-09 ENCOUNTER — Ambulatory Visit: Payer: 59 | Admitting: Orthopaedic Surgery

## 2020-06-18 ENCOUNTER — Encounter: Payer: Self-pay | Admitting: Orthopaedic Surgery

## 2020-06-18 ENCOUNTER — Ambulatory Visit (INDEPENDENT_AMBULATORY_CARE_PROVIDER_SITE_OTHER): Payer: 59 | Admitting: Orthopaedic Surgery

## 2020-06-18 DIAGNOSIS — Z9889 Other specified postprocedural states: Secondary | ICD-10-CM

## 2020-06-18 NOTE — Progress Notes (Signed)
Patient ID: Tina Valdez, female   DOB: August 17, 1956, 64 y.o.   MRN: [id number]  Tierany is 7 weeks status post right carpal tunnel release.  Overall doing well.  Sleeping much better.  The symptoms have improved significantly.  She has some incisional tenderness and some mild pillar discomfort but overall she is very happy with her outcome.  She has pretty much resumed all her normal activities.  From my standpoint she has done very well and we can release her at this point to activity as tolerated.  Follow-up as needed.

## 2022-08-18 ENCOUNTER — Ambulatory Visit (HOSPITAL_BASED_OUTPATIENT_CLINIC_OR_DEPARTMENT_OTHER)
Admission: RE | Admit: 2022-08-18 | Discharge: 2022-08-18 | Disposition: A | Discharge status: Home / Self Care | Payer: Medicare Other | Source: Ambulatory Visit | Attending: Family Medicine | Admitting: Family Medicine

## 2022-08-18 ENCOUNTER — Other Ambulatory Visit (HOSPITAL_BASED_OUTPATIENT_CLINIC_OR_DEPARTMENT_OTHER): Payer: Self-pay | Admitting: Family Medicine

## 2022-08-18 DIAGNOSIS — M79604 Pain in right leg: Secondary | ICD-10-CM | POA: Insufficient documentation

## 2023-03-16 ENCOUNTER — Other Ambulatory Visit (INDEPENDENT_AMBULATORY_CARE_PROVIDER_SITE_OTHER): Payer: Medicare Other

## 2023-03-16 ENCOUNTER — Ambulatory Visit (INDEPENDENT_AMBULATORY_CARE_PROVIDER_SITE_OTHER): Payer: Medicare Other | Admitting: Orthopedic Surgery

## 2023-03-16 ENCOUNTER — Encounter: Payer: Self-pay | Admitting: Orthopedic Surgery

## 2023-03-16 DIAGNOSIS — M6702 Short Achilles tendon (acquired), left ankle: Secondary | ICD-10-CM

## 2023-03-16 DIAGNOSIS — M25572 Pain in left ankle and joints of left foot: Secondary | ICD-10-CM | POA: Diagnosis not present

## 2023-03-16 NOTE — Progress Notes (Signed)
Office Visit Note   Patient: Tina Valdez           Date of Birth: 11-29-1955           MRN: [id number] Visit Date: 03/16/2023              Requested by: [place], Betty G, MD [address] [place],  [place] [address] PCP: [place], Betty G, MD  Chief Complaint  Patient presents with   Left Ankle - Pain      HPI: Patient is a 67 year old woman who presents complaining of global pain around the left heel.  She is feels like she is walking on a rock on the plantar aspect of her heel.  Patient complains of pain with ambulation.  Assessment & Plan: Visit Diagnoses:  1. Pain in left ankle and joints of left foot   2. Achilles tendon contracture, left     Plan: Patient was given instructions and demonstrated Achilles stretching.  Recommended Voltaren gel 4 times a day.  Follow-up with Dr. Shon Baton for evaluation for shockwave therapy for the Achilles and plantar fascia.  Follow-Up Instructions: No follow-ups on file.   Ortho Exam  Patient is alert, oriented, no adenopathy, well-dressed, normal affect, normal respiratory effort. Examination patient has a palpable dorsalis pedis pulse.  With the knee extended she lacks dorsiflexion of 20 degrees to neutral with significant Achilles contracture.  Patient is very tender to palpation over the distal Achilles and tender to palpation of the origin of the plantar fascia.  There is no palpable defects of the plantar fascia or Achilles.  Imaging: XR Ankle 2 Views Left  Result Date: 03/16/2023 2 view radiographs of the left ankle shows a congruent joint with calcification of the Achilles at its insertion and a plantar heel spur.  XR Foot 2 Views Left  Result Date: 03/16/2023 2 view radiographs of the left foot shows some bony spurs through the midfoot involving the talonavicular and navicular cuneiform joints.  She also has calcification of the Achilles at its insertion.  There is a large plantar spur.  No images are attached to the  encounter.  Labs: No results found for: "HGBA1C", "ESRSEDRATE", "CRP", "LABURIC", "REPTSTATUS", "GRAMSTAIN", "CULT", "LABORGA"   Lab Results  Component Value Date   ALBUMIN 3.0 (L) 10/24/2015   ALBUMIN 4.1 10/09/2015    No results found for: "MG" No results found for: "VD25OH"  No results found for: "PREALBUMIN"    Latest Ref Rng & Units 04/29/2020   10:07 AM 10/24/2015   10:45 AM 10/22/2015    5:43 AM  CBC EXTENDED  WBC 4.0 - 10.5 K/uL 7.6  25.2  15.4   RBC 3.87 - 5.11 MIL/uL 5.14  4.17  3.88   Hemoglobin 12.0 - 15.0 g/dL 19.1  47.8  29.5   HCT 36.0 - 46.0 % 46.2  36.1  34.0   Platelets 150 - 400 K/uL 372  484  418   NEUT# 1.7 - 7.7 K/uL  22.2    Lymph# 0.7 - 4.0 K/uL  1.0       There is no height or weight on file to calculate BMI.  Orders:  Orders Placed This Encounter  Procedures   XR Ankle 2 Views Left   XR Foot 2 Views Left   No orders of the defined types were placed in this encounter.    Procedures: No procedures performed  Clinical Data: No additional findings.  ROS:  All other systems negative, except as noted in  the HPI. Review of Systems  Objective: Vital Signs: There were no vitals taken for this visit.  Specialty Comments:  No specialty comments available.  PMFS History: Patient Active Problem List   Diagnosis Date Noted   Carpal tunnel syndrome on right 04/28/2020   Postmenopausal bleeding 10/21/2015   Past Medical History:  Diagnosis Date   Anemia    vitamin D deficiency "years ago)   Medical history non-contributory    Pneumonia     No family history on file.  Past Surgical History:  Procedure Laterality Date   ANKLE SURGERY     BILATERAL SALPINGECTOMY Bilateral 10/21/2015   Procedure: BILATERAL SALPINGECTOMY;  Surgeon: Myna Hidalgo, DO;  Location: [facility];  Service: Gynecology;  Laterality: Bilateral;   CARPAL TUNNEL RELEASE     Left wrist   CARPAL TUNNEL RELEASE Right 04/29/2020   Procedure: RIGHT CARPAL TUNNEL RELEASE;   Surgeon: Tarry Kos, MD;  Location: [facility];  Service: Orthopedics;  Laterality: Right;   CESAREAN SECTION     CHOLECYSTECTOMY     COLONOSCOPY     DILATION AND CURETTAGE OF UTERUS     LAPAROSCOPIC VAGINAL HYSTERECTOMY WITH SALPINGECTOMY Bilateral 10/21/2015   Procedure: LAPAROSCOPIC ASSISTED VAGINAL HYSTERECTOMY ;  Surgeon: Myna Hidalgo, DO;  Location: [facility];  Service: Gynecology;  Laterality: Bilateral;   PILONIDAL CYST EXCISION     Social History   Occupational History   Not on file  Tobacco Use   Smoking status: Never   Smokeless tobacco: Never  Substance and Sexual Activity   Alcohol use: No   Drug use: No   Sexual activity: Yes    Birth control/protection: Post-menopausal

## 2023-03-24 ENCOUNTER — Ambulatory Visit (INDEPENDENT_AMBULATORY_CARE_PROVIDER_SITE_OTHER): Payer: Medicare Other | Admitting: Sports Medicine

## 2023-03-24 ENCOUNTER — Encounter: Payer: Self-pay | Admitting: Sports Medicine

## 2023-03-24 DIAGNOSIS — M76822 Posterior tibial tendinitis, left leg: Secondary | ICD-10-CM

## 2023-03-24 DIAGNOSIS — M25572 Pain in left ankle and joints of left foot: Secondary | ICD-10-CM

## 2023-03-24 DIAGNOSIS — M7732 Calcaneal spur, left foot: Secondary | ICD-10-CM | POA: Diagnosis not present

## 2023-03-24 DIAGNOSIS — M7660 Achilles tendinitis, unspecified leg: Secondary | ICD-10-CM

## 2023-03-24 NOTE — Progress Notes (Signed)
Months of pain No injury Pain is primarily over achilles, but can be on the heel at times  "Sharp" sensation

## 2023-03-24 NOTE — Progress Notes (Signed)
Oprah Handke - 67 y.o. female MRN [id number]  Date of birth: [birth year]  Office Visit Note: Visit Date: 03/24/2023 PCP: [place], Betty G, MD Referred by: [place], Betty G, MD  Subjective: Chief Complaint  Patient presents with   Left Foot - Pain   HPI: Tina Valdez is a pleasant 67 y.o. female who presents today for chronic left heel and achilles pain.  Pashence reports a few months of heel and left foot pain.  Denies any specific injury.  Has pain over the distal Achilles but also over the heel and the medial aspect of the foot.  Feels like a sharp stabbing sensation.  Previously saw Dr. Lajoyce Corners on 03/16/2023 and stated it felt like " walking on a rock."  Does have calcaneal spurs, did have prior resection of these in years past.  Using topical Voltaren gel, ibuprofen as needed.  Independent chart review and discussion with patient with prior left ankle surgery with Dr. Lajoyce Corners for bone spur resection, it Achilles takedown and reattachment.  Also prior carpal tunnel surgery performed by Dr. Roda Shutters on 04/29/2020.   Pertinent ROS were reviewed with the patient and found to be negative unless otherwise specified above in HPI.   Assessment & Plan: Visit Diagnoses:  1. Pain in left ankle and joints of left foot   2. Distal Achilles tendinitis   3. Calcaneal spur of left foot   4. Posterior tibial tendon dysfunction, left    Plan: Discussed with Deller that I do think her foot and ankle pain is multifactorial.  She does have a moderate-sized plantar calcaneal spur as well as rather large calcification of the distal insertional Achilles, she is tender over both of these locations.  She also has a degree of posterior tibial dysfunction with pain in this location as it courses posterior to the medial malleolus as well as difficulty raising up with bilateral heel raise.  We discussed all treatment options, through shared decision making proceed with a trial of extracorporeal shockwave therapy to these locations.   She may continue her topical Voltaren gel and ibuprofen as needed.  We did discuss potentially getting her into a soft orthotic to help support her arch and heel.  Would like to follow-up in about 1-1.5 weeks for repeat second trial of piece ESWT to see if this is beneficial for further treatments down the road.  She will continue her Achilles and heel stretching and home rehab until that time.  Additional treatment considerations: Prescription anti-inflammatory, heel pad or soft orthotic, advanced imaging such as MRI  Follow-up: Return in about 11 days (around 04/04/2023) for 6/11 or 6/12 for left heel/achilles (reg visit).   Meds & Orders: No orders of the defined types were placed in this encounter.  No orders of the defined types were placed in this encounter.    Procedures: Procedure: ECSWT Indications:  Achilles tendinopathy, Bone Spurs, Plantar fasciitis   Procedure Details Consent: Risks of procedure as well as the alternatives and risks of each were explained to the patient.  Verbal consent for procedure obtained. Time Out: Verified patient identification, verified procedure, site was marked, verified correct patient position. The area was cleaned with alcohol swab.     The left plantar fascia was targeted for Extracorporeal shockwave therapy.    Preset: Plantar fasciitis Power Level: 80-90 mJ Frequency: 10 Hz Impulse/cycles: 2000 Head size: Regular   Patient tolerated procedure well without immediate complications.  The left achilles (and some PT tendon) was targeted for Extracorporeal shockwave therapy.  Preset: Achillodynia Power Level: 60-90 mJ Frequency: 12 Hz Impulse/cycles: 2000 Head size: Regular   Patient tolerated procedure well without immediate complications.         Clinical History: No specialty comments available.  She reports that she has never smoked. She has never used smokeless tobacco. No results for input(s): "HGBA1C", "LABURIC" in the last  8760 hours.  Objective:   Vital Signs: There were no vitals taken for this visit.  Physical Exam  Gen: Well-appearing, in no acute distress; non-toxic CV: Well-perfused. Warm.  Resp: Breathing unlabored on room air; no wheezing. Psych: Fluid speech in conversation; appropriate affect; normal thought process Neuro: Sensation intact throughout. No gross coordination deficits.   Ortho Exam - Left foot/ankle: Trace pedal edema of the ankle and foot.  Tenderness to palpation over the plantar aspect of the calcaneus, as well as the posterior tibial tendon just posterior to the medial malleolus, there is TTP over the distal Achilles insertion as well with some bony bossing here.  Well-healed previous surgical scar. Thre is restriction with dorsiflexion but full range with plantarflexion.  There is posterior tibial tendon insufficiency with difficulty raising up with bilateral heel raise. NVI.  Imaging:  *Independent review and interpretation of 2 view left ankle Left foot x-ray from 03/16/2023 demonstrates moderate posterior calcaneal spur, there is insertional distal Achilles calcification off the superior aspect of the calcaneus.  Mild to moderate talonavicular and midfoot arthritic change.  XR Ankle 2 Views Left 2 view radiographs of the left ankle shows a congruent joint with  calcification of the Achilles at its insertion and a plantar heel spur. XR Foot 2 Views Left 2 view radiographs of the left foot shows some bony spurs through the  midfoot involving the talonavicular and navicular cuneiform joints.  She  also has calcification of the Achilles at its insertion.  There is a large  plantar spur.    Past Medical/Family/Surgical/Social History: Medications & Allergies reviewed per EMR, new medications updated. Patient Active Problem List   Diagnosis Date Noted   Carpal tunnel syndrome on right 04/28/2020   Postmenopausal bleeding 10/21/2015   Past Medical History:  Diagnosis Date    Anemia    vitamin D deficiency "years ago)   Medical history non-contributory    Pneumonia    History reviewed. No pertinent family history. Past Surgical History:  Procedure Laterality Date   ANKLE SURGERY     BILATERAL SALPINGECTOMY Bilateral 10/21/2015   Procedure: BILATERAL SALPINGECTOMY;  Surgeon: Myna Hidalgo, DO;  Location: [facility];  Service: Gynecology;  Laterality: Bilateral;   CARPAL TUNNEL RELEASE     Left wrist   CARPAL TUNNEL RELEASE Right 04/29/2020   Procedure: RIGHT CARPAL TUNNEL RELEASE;  Surgeon: Tarry Kos, MD;  Location: [facility];  Service: Orthopedics;  Laterality: Right;   CESAREAN SECTION     CHOLECYSTECTOMY     COLONOSCOPY     DILATION AND CURETTAGE OF UTERUS     LAPAROSCOPIC VAGINAL HYSTERECTOMY WITH SALPINGECTOMY Bilateral 10/21/2015   Procedure: LAPAROSCOPIC ASSISTED VAGINAL HYSTERECTOMY ;  Surgeon: Myna Hidalgo, DO;  Location: [facility];  Service: Gynecology;  Laterality: Bilateral;   PILONIDAL CYST EXCISION     Social History   Occupational History   Not on file  Tobacco Use   Smoking status: Never   Smokeless tobacco: Never  Substance and Sexual Activity   Alcohol use: No   Drug use: No   Sexual activity: Yes    Birth control/protection: Post-menopausal

## 2023-04-04 ENCOUNTER — Ambulatory Visit (INDEPENDENT_AMBULATORY_CARE_PROVIDER_SITE_OTHER): Payer: Medicare Other | Admitting: Sports Medicine

## 2023-04-04 ENCOUNTER — Encounter: Payer: Self-pay | Admitting: Sports Medicine

## 2023-04-04 DIAGNOSIS — M25572 Pain in left ankle and joints of left foot: Secondary | ICD-10-CM

## 2023-04-04 DIAGNOSIS — M7660 Achilles tendinitis, unspecified leg: Secondary | ICD-10-CM | POA: Diagnosis not present

## 2023-04-04 DIAGNOSIS — M7732 Calcaneal spur, left foot: Secondary | ICD-10-CM

## 2023-04-04 DIAGNOSIS — M76822 Posterior tibial tendinitis, left leg: Secondary | ICD-10-CM | POA: Diagnosis not present

## 2023-04-04 MED ORDER — METHYLPREDNISOLONE 4 MG PO TBPK: ORAL_TABLET | ORAL | 0 refills | Status: DC

## 2023-04-04 NOTE — Progress Notes (Addendum)
Tina Valdez - 67 y.o. female MRN [id number]  Date of birth: 23-Feb-1956  Office Visit Note: Visit Date: 04/04/2023 PCP: [place], Betty G, MD Referred by: [place], Betty G, MD  Subjective: Chief Complaint  Patient presents with   Left Ankle - Follow-up   HPI: Tina Valdez is a pleasant 67 y.o. female who presents today for follow-up of acute on chronic left ankle pain.  Following up for left heel, calcaneal spur, distal Achilles pain.  Did perform a trial of extracorporeal shockwave therapy on 03/24/2023, she had a essentially no pain and had great improvement after the first 2 days of shockwave, however after about 3 to 4 days her pain returned and is rather bothersome currently.  She does feel like the shockwave helped with her plantar fascia and heel spur pain as this is markedly improved but still having more of her pain over the distal Achilles and near the posterior tibial tendon.  Using ibuprofen and Voltaren gel.  As a reminder, has a history of prior left ankle surgery with Dr. Lajoyce Corners for bone spur resection, it Achilles takedown and reattachment.   Pertinent ROS were reviewed with the patient and found to be negative unless otherwise specified above in HPI.   Assessment & Plan: Visit Diagnoses:  1. Pain in left ankle and joints of left foot   2. Distal Achilles tendinitis   3. Calcaneal spur of left foot   4. Posterior tibial tendon dysfunction, left    Plan: Discussed with Karsen that her foot and ankle pain is largely multifactorial.  I do think she did receive rather good relief from her heel and calcaneal spur with ECSWT.  This still bothers her, although more bothersome is her Achilles enthesiopathy and her posterior tibial tendon.  Given that she has some swelling in this location as well and increased pain, we will start her on a 6-day course of methylprednisolone.  Did allow her to have a trial of a small 5/16" heel lift bilaterally to help offload the achilles tendon.  I  would like to see her back in 1 week for reevaluation to see what sort of improvement she received from extracorporeal shockwave therapy, if she is around 20% improved or more we may consider additional treatments.  If she is not, we may suggest her going back to see Dr. Lajoyce Corners to see what surgery or other treatment options she has.  Other considerations may include ultrasound or advanced imaging such as MRI.  Could also ascertain a posterior tibial tendon injection.  Follow-up: Return in about 1 week (around 04/11/2023) for for right heel/achilles (reg visit).   Meds & Orders:  Meds ordered this encounter  Medications   methylPREDNISolone (MEDROL DOSEPAK) 4 MG TBPK tablet    Sig: Take per packet instructions. Taper dosing.    Dispense:  1 each    Refill:  0   No orders of the defined types were placed in this encounter.    Procedures: Procedure: ECSWT Indications:  Achilles tendinopathy, Bone Spurs, Plantar fasciitis   Procedure Details Consent: Risks of procedure as well as the alternatives and risks of each were explained to the patient.  Verbal consent for procedure obtained. Time Out: Verified patient identification, verified procedure, site was marked, verified correct patient position. The area was cleaned with alcohol swab.     The left plantar fascia was targeted for Extracorporeal shockwave therapy.    Preset: Plantar fasciitis Power Level: 90 mJ Frequency: 10 Hz Impulse/cycles: 2000 Head size: Regular  Patient tolerated procedure well without immediate complications.   The left achilles (and some PT tendon) was targeted for Extracorporeal shockwave therapy.    Preset: Achillodynia Power Level: 70-80 mJ Frequency: 12 Hz Impulse/cycles: 2000 Head size: Regular   Patient tolerated procedure well without immediate complications.      Clinical History: No specialty comments available.  She reports that she has never smoked. She has never used smokeless tobacco. No  results for input(s): "HGBA1C", "LABURIC" in the last 8760 hours.  Objective:   Vital Signs: There were no vitals taken for this visit.  Physical Exam  Gen: Well-appearing, in no acute distress; non-toxic CV: Well-perfused. Warm.  Resp: Breathing unlabored on room air; no wheezing. Psych: Fluid speech in conversation; appropriate affect; normal thought process Neuro: Sensation intact throughout. No gross coordination deficits.   Ortho Exam - Left foot/ankle: There is some edema of the ankle and foot as well as some soft tissue swelling over the medial ankle overlying the posterior tibial tendon.  Positive TTP over the distal Achilles and the posterior calcaneus.  There is also TTP over the posterior aspect of the medial malleolus near the posterior tibial tendon.  Well-healed previous surgical scar.  There is restriction with dorsiflexion.  Posterior tibial tendon insufficiency with pain and difficulty raising up onto the ball of the foot. NVI.  Imaging:   Previous X-ray imaging: XR Ankle 2 Views Left 2 view radiographs of the left ankle shows a congruent joint with  calcification of the Achilles at its insertion and a plantar heel spur. XR Foot 2 Views Left 2 view radiographs of the left foot shows some bony spurs through the  midfoot involving the talonavicular and navicular cuneiform joints.  She  also has calcification of the Achilles at its insertion.  There is a large  plantar spur.  Past Medical/Family/Surgical/Social History: Medications & Allergies reviewed per EMR, new medications updated. Patient Active Problem List   Diagnosis Date Noted   Carpal tunnel syndrome on right 04/28/2020   Postmenopausal bleeding 10/21/2015   Past Medical History:  Diagnosis Date   Anemia    vitamin D deficiency "years ago)   Medical history non-contributory    Pneumonia    No family history on file. Past Surgical History:  Procedure Laterality Date   ANKLE SURGERY     BILATERAL  SALPINGECTOMY Bilateral 10/21/2015   Procedure: BILATERAL SALPINGECTOMY;  Surgeon: Myna Hidalgo, DO;  Location: [facility];  Service: Gynecology;  Laterality: Bilateral;   CARPAL TUNNEL RELEASE     Left wrist   CARPAL TUNNEL RELEASE Right 04/29/2020   Procedure: RIGHT CARPAL TUNNEL RELEASE;  Surgeon: Tarry Kos, MD;  Location: [facility];  Service: Orthopedics;  Laterality: Right;   CESAREAN SECTION     CHOLECYSTECTOMY     COLONOSCOPY     DILATION AND CURETTAGE OF UTERUS     LAPAROSCOPIC VAGINAL HYSTERECTOMY WITH SALPINGECTOMY Bilateral 10/21/2015   Procedure: LAPAROSCOPIC ASSISTED VAGINAL HYSTERECTOMY ;  Surgeon: Myna Hidalgo, DO;  Location: [facility];  Service: Gynecology;  Laterality: Bilateral;   PILONIDAL CYST EXCISION     Social History   Occupational History   Not on file  Tobacco Use   Smoking status: Never   Smokeless tobacco: Never  Substance and Sexual Activity   Alcohol use: No   Drug use: No   Sexual activity: Yes    Birth control/protection: Post-menopausal

## 2023-04-04 NOTE — Progress Notes (Signed)
States she is worse; Day 1-2 no pain Day 3 pain returned Day 4 pain worsened

## 2023-04-13 ENCOUNTER — Ambulatory Visit (INDEPENDENT_AMBULATORY_CARE_PROVIDER_SITE_OTHER): Payer: Medicare Other | Admitting: Sports Medicine

## 2023-04-13 DIAGNOSIS — M76822 Posterior tibial tendinitis, left leg: Secondary | ICD-10-CM

## 2023-04-13 DIAGNOSIS — M7660 Achilles tendinitis, unspecified leg: Secondary | ICD-10-CM

## 2023-04-13 DIAGNOSIS — M25572 Pain in left ankle and joints of left foot: Secondary | ICD-10-CM | POA: Diagnosis not present

## 2023-04-13 DIAGNOSIS — M7732 Calcaneal spur, left foot: Secondary | ICD-10-CM | POA: Diagnosis not present

## 2023-04-13 NOTE — Progress Notes (Signed)
Tina Valdez - 67 y.o. female MRN [id number]  Date of birth: 04-Sep-1956  Office Visit Note: Visit Date: 04/13/2023 PCP: [place], Betty G, MD Referred by: [place], Betty G, MD  Subjective: Chief Complaint  Patient presents with   Left Achilles Tendon - Follow-up, Pain   Left Foot - Pain, Follow-up   HPI: Tina Valdez is a pleasant 67 y.o. female who presents today for follow-up of chronic left ankle and foot pain.  Tina Valdez has multiple conditions relating to her pain including distal Achilles tendinopathy with enthesophytes, plantar fasciitis, calcaneal heel spur, as well as a degree of posterior tibial dysfunction.  She has completed her methylprednisolone taper pack and we did perform a second trial of extracorporeal shockwave therapy, she states that she is between 70 and 80% improved.  Most of her pain around the Achilles and ankle is improved, still has some pain over her plantar calcaneal spur but previously felt like she was walking on a rock, and now feels like she is only walking on a pebble.  Has been being able to walk much better and with less pain per Tina Valdez and her husband.  Pertinent ROS were reviewed with the patient and found to be negative unless otherwise specified above in HPI.   Assessment & Plan: Visit Diagnoses:  1. Pain in left ankle and joints of left foot   2. Calcaneal spur of left foot   3. Distal Achilles tendinitis   4. Posterior tibial tendon dysfunction, left    Plan: Both Siann and I were pleased with the improvement she has been making with extracorporeal shockwave therapy, her home stretching and strengthening exercises and her previous prednisone taper pack.  We did repeat use ESWT today, she will follow-up next week for an additional treatment.  Will shoot for somewhere between 5-7 sessions depending on her level of improvement for taking a holiday.  She may continue on the 5/16 inch heel lift bilaterally to help offload the Achilles tendon, ultimately we  will work on discontinuing this.  She will follow-up next week.  Follow-up: Return in about 1 week (around 04/20/2023) for f/u left heel/achilles (reg visit).   Meds & Orders: No orders of the defined types were placed in this encounter.  No orders of the defined types were placed in this encounter.    Procedures: Procedure: ECSWT Indications:  Achilles tendinopathy, Bone Spurs, Plantar fasciitis   Procedure Details Consent: Risks of procedure as well as the alternatives and risks of each were explained to the patient.  Verbal consent for procedure obtained. Time Out: Verified patient identification, verified procedure, site was marked, verified correct patient position. The area was cleaned with alcohol swab.     The left plantar fascia was targeted for Extracorporeal shockwave therapy.    Preset: Plantar fasciitis Power Level: 100 mJ Frequency: 10 Hz Impulse/cycles: 2200 Head size: Regular   Patient tolerated procedure well without immediate complications.   The left achilles (and some PT tendon) was targeted for Extracorporeal shockwave therapy.    Preset: Achillodynia Power Level: 80 mJ Frequency: 12 Hz Impulse/cycles: 2000 Head size: Regular   Patient tolerated procedure well without immediate complications.       Clinical History: No specialty comments available.  She reports that she has never smoked. She has never used smokeless tobacco. No results for input(s): "HGBA1C", "LABURIC" in the last 8760 hours.  Objective:   Vital Signs: There were no vitals taken for this visit.  Physical Exam  Gen: Well-appearing, in no  acute distress; non-toxic CV: Well-perfused. Warm.  Resp: Breathing unlabored on room air; no wheezing. Psych: Fluid speech in conversation; appropriate affect; normal thought process Neuro: Sensation intact throughout. No gross coordination deficits.   Ortho Exam -Left foot/ankle: Positive TTP over the plantar aspect of the calcaneus more  centimeter calcaneal spur.  Well-healed previous surgical scar.  Mild restriction in dorsiflexion, otherwise no acute findings.  Imaging: No results found.  Past Medical/Family/Surgical/Social History: Medications & Allergies reviewed per EMR, new medications updated. Patient Active Problem List   Diagnosis Date Noted   Carpal tunnel syndrome on right 04/28/2020   Postmenopausal bleeding 10/21/2015   Past Medical History:  Diagnosis Date   Anemia    vitamin D deficiency "years ago)   Medical history non-contributory    Pneumonia    No family history on file. Past Surgical History:  Procedure Laterality Date   ANKLE SURGERY     BILATERAL SALPINGECTOMY Bilateral 10/21/2015   Procedure: BILATERAL SALPINGECTOMY;  Surgeon: Myna Hidalgo, DO;  Location: [facility];  Service: Gynecology;  Laterality: Bilateral;   CARPAL TUNNEL RELEASE     Left wrist   CARPAL TUNNEL RELEASE Right 04/29/2020   Procedure: RIGHT CARPAL TUNNEL RELEASE;  Surgeon: Tarry Kos, MD;  Location: [facility];  Service: Orthopedics;  Laterality: Right;   CESAREAN SECTION     CHOLECYSTECTOMY     COLONOSCOPY     DILATION AND CURETTAGE OF UTERUS     LAPAROSCOPIC VAGINAL HYSTERECTOMY WITH SALPINGECTOMY Bilateral 10/21/2015   Procedure: LAPAROSCOPIC ASSISTED VAGINAL HYSTERECTOMY ;  Surgeon: Myna Hidalgo, DO;  Location: [facility];  Service: Gynecology;  Laterality: Bilateral;   PILONIDAL CYST EXCISION     Social History   Occupational History   Not on file  Tobacco Use   Smoking status: Never   Smokeless tobacco: Never  Substance and Sexual Activity   Alcohol use: No   Drug use: No   Sexual activity: Yes    Birth control/protection: Post-menopausal

## 2023-04-20 ENCOUNTER — Ambulatory Visit (INDEPENDENT_AMBULATORY_CARE_PROVIDER_SITE_OTHER): Payer: Medicare Other | Admitting: Sports Medicine

## 2023-04-20 ENCOUNTER — Encounter: Payer: Self-pay | Admitting: Sports Medicine

## 2023-04-20 DIAGNOSIS — M25572 Pain in left ankle and joints of left foot: Secondary | ICD-10-CM

## 2023-04-20 DIAGNOSIS — M25472 Effusion, left ankle: Secondary | ICD-10-CM

## 2023-04-20 DIAGNOSIS — M7660 Achilles tendinitis, unspecified leg: Secondary | ICD-10-CM

## 2023-04-20 DIAGNOSIS — M7732 Calcaneal spur, left foot: Secondary | ICD-10-CM | POA: Diagnosis not present

## 2023-04-20 DIAGNOSIS — M76822 Posterior tibial tendinitis, left leg: Secondary | ICD-10-CM

## 2023-04-20 MED ORDER — METHYLPREDNISOLONE 4 MG PO TBPK: ORAL_TABLET | ORAL | 0 refills | Status: DC

## 2023-04-20 NOTE — Progress Notes (Signed)
Tina Valdez - 67 y.o. female MRN [id number]  Date of birth: 11-28-1955  Office Visit Note: Visit Date: 04/20/2023 PCP: [place], Betty G, MD Referred by: [place], Betty G, MD  Subjective: Chief Complaint  Patient presents with   Left Heel - Follow-up   HPI: Tina Valdez is a pleasant 67 y.o. female who presents today for follow-up of chronic left ankle and foot pain.  Has had 3 sessions of ECSWT - has been making good improvements of her pain Also completed Medrol-dosepak in past Her husband states today just a few days ago she was noting how much better her ankle moved when flexing and extending it.  He has noticed a difference in her pain as well. However, few days ago she was doing a lot of standing/walking and feels like the whole ankle started to swell up on her.  It is not as painful over the Achilles and heel as in the past but more so over the medial ankle joint.  No redness, but does note some swelling and warmth to it.  She denies any history of gout.  Pertinent ROS were reviewed with the patient and found to be negative unless otherwise specified above in HPI.   Assessment & Plan: Visit Diagnoses:  1. Pain in left ankle and joints of left foot   2. Calcaneal spur of left foot   3. Distal Achilles tendinitis   4. Posterior tibial tendon dysfunction, left   5. Left ankle swelling    Plan: Discussed with Tina Valdez that she certainly has been making improvements in her distal Achilles tendinopathy, PT tendon dysfunction and calcaneal spur with shockwave therapy.  Here just over the last few days she did exacerbate the ankle itself as she has some swelling through the ankle.  I think this is the main driver of her pain today.  Given the increased swelling and pain, will repeat Medrol Dosepak today for 6 days.  I would like her to rest the ankle from home exercises, prolonged walking, as well as ice and elevate through the weekend.  Starting back on Monday as her pain improves she will  get back into her home therapy.  We will take a short holiday from extracorporeal shockwave treatment and have her follow-up in about 3 weeks to decipher the degree of improvement from her Achilles, calcaneal spur, and ankle pain.  Depending on how her pain is doing we can consider additional treatments of shockwave and/or sending her back to Dr. Lajoyce Corners for possible surgical evaluation if she is not doing better. She is agreeable to this plan.  Follow-up: Return in about 3 weeks (around 05/11/2023) for for left heel/achilles (30-min for [place]).   Meds & Orders:  Meds ordered this encounter  Medications   methylPREDNISolone (MEDROL DOSEPAK) 4 MG TBPK tablet    Sig: Take per packet instructions. Taper dosing.    Dispense:  1 each    Refill:  0   No orders of the defined types were placed in this encounter.    Procedures: Procedure: ECSWT Indications:  Achilles tendinopathy, Bone Spurs, Plantar fasciitis   Procedure Details Consent: Risks of procedure as well as the alternatives and risks of each were explained to the patient.  Verbal consent for procedure obtained. Time Out: Verified patient identification, verified procedure, site was marked, verified correct patient position. The area was cleaned with alcohol swab.     The left plantar fascia was targeted for Extracorporeal shockwave therapy.    Preset: Plantar fasciitis Power  Level: 100 mJ Frequency: 10 Hz Impulse/cycles: 2200 Head size: Regular   Patient tolerated procedure well without immediate complications.   The left achilles (and some PT tendon) was targeted for Extracorporeal shockwave therapy.    Preset: Achillodynia Power Level: 70 mJ Frequency: 10 Hz Impulse/cycles: 2000 (250 over PT tendon) Head size: Regular   Patient tolerated procedure well without immediate complications.      Clinical History: No specialty comments available.  She reports that she has never smoked. She has never used smokeless tobacco. No  results for input(s): "HGBA1C", "LABURIC" in the last 8760 hours.  Objective:   Vital Signs: There were no vitals taken for this visit.  Physical Exam  Gen: Well-appearing, in no acute distress; non-toxic CV: Regular Rate. Well-perfused. Warm.  Resp: Breathing unlabored on room air; no wheezing. Psych: Fluid speech in conversation; appropriate affect; normal thought process Neuro: Sensation intact throughout. No gross coordination deficits.   Ortho Exam -Left ankle/foot.  There is generalized TTP around the ankle with soft tissue swelling on the medial and lateral compartment.  There is a mild TTP over the distal Achilles, none over the insertion of the plantar fascia.  There is mild warmth to the ankle but no redness.  Full strength with dorsiflexion and plantarflexion.  Imaging: No results found.  Past Medical/Family/Surgical/Social History: Medications & Allergies reviewed per EMR, new medications updated. Patient Active Problem List   Diagnosis Date Noted   Carpal tunnel syndrome on right 04/28/2020   Postmenopausal bleeding 10/21/2015   Past Medical History:  Diagnosis Date   Anemia    vitamin D deficiency "years ago)   Medical history non-contributory    Pneumonia    No family history on file. Past Surgical History:  Procedure Laterality Date   ANKLE SURGERY     BILATERAL SALPINGECTOMY Bilateral 10/21/2015   Procedure: BILATERAL SALPINGECTOMY;  Surgeon: Myna Hidalgo, DO;  Location: [facility];  Service: Gynecology;  Laterality: Bilateral;   CARPAL TUNNEL RELEASE     Left wrist   CARPAL TUNNEL RELEASE Right 04/29/2020   Procedure: RIGHT CARPAL TUNNEL RELEASE;  Surgeon: Tarry Kos, MD;  Location: [facility];  Service: Orthopedics;  Laterality: Right;   CESAREAN SECTION     CHOLECYSTECTOMY     COLONOSCOPY     DILATION AND CURETTAGE OF UTERUS     LAPAROSCOPIC VAGINAL HYSTERECTOMY WITH SALPINGECTOMY Bilateral 10/21/2015   Procedure: LAPAROSCOPIC ASSISTED VAGINAL HYSTERECTOMY  ;  Surgeon: Myna Hidalgo, DO;  Location: [facility];  Service: Gynecology;  Laterality: Bilateral;   PILONIDAL CYST EXCISION     Social History   Occupational History   Not on file  Tobacco Use   Smoking status: Never   Smokeless tobacco: Never  Substance and Sexual Activity   Alcohol use: No   Drug use: No   Sexual activity: Yes    Birth control/protection: Post-menopausal

## 2023-04-20 NOTE — Progress Notes (Signed)
Doing ok; states she had a few days of pain that started again here recently But has noticed a change with shockwave

## 2023-05-10 ENCOUNTER — Ambulatory Visit: Payer: Medicare Other | Admitting: Sports Medicine

## 2023-11-10 ENCOUNTER — Other Ambulatory Visit: Payer: Self-pay | Admitting: Family Medicine

## 2023-11-10 DIAGNOSIS — M5430 Sciatica, unspecified side: Secondary | ICD-10-CM

## 2023-11-17 ENCOUNTER — Ambulatory Visit
Admission: RE | Admit: 2023-11-17 | Discharge: 2023-11-17 | Disposition: A | Discharge status: Home / Self Care | Payer: Medicare Other | Source: Ambulatory Visit | Attending: Family Medicine | Admitting: Family Medicine

## 2023-11-17 DIAGNOSIS — M5430 Sciatica, unspecified side: Secondary | ICD-10-CM

## 2024-01-08 ENCOUNTER — Other Ambulatory Visit: Payer: Self-pay

## 2024-01-08 DIAGNOSIS — I872 Venous insufficiency (chronic) (peripheral): Secondary | ICD-10-CM

## 2024-01-09 ENCOUNTER — Ambulatory Visit (HOSPITAL_COMMUNITY)
Admission: RE | Admit: 2024-01-09 | Discharge: 2024-01-09 | Disposition: A | Discharge status: Home / Self Care | Source: Ambulatory Visit | Attending: Vascular Surgery | Admitting: Vascular Surgery

## 2024-01-09 DIAGNOSIS — I872 Venous insufficiency (chronic) (peripheral): Secondary | ICD-10-CM | POA: Diagnosis not present

## 2024-01-30 ENCOUNTER — Ambulatory Visit (INDEPENDENT_AMBULATORY_CARE_PROVIDER_SITE_OTHER): Admitting: Vascular Surgery

## 2024-01-30 ENCOUNTER — Encounter: Payer: Self-pay | Admitting: Vascular Surgery

## 2024-01-30 VITALS — BP 179/109 | HR 78 | Temp 98.4°F | Ht 63.0 in | Wt 298.1 lb

## 2024-01-30 DIAGNOSIS — I872 Venous insufficiency (chronic) (peripheral): Secondary | ICD-10-CM | POA: Diagnosis not present

## 2024-01-30 NOTE — Progress Notes (Signed)
 [facility] [place]  ASSESSMENT / PLAN: Aneyah Lortz is a 68 y.o. female with chronic venous insufficiency of right lower extremity causing reticular veins.  Venous duplex is significant for deep venous reflux; no significant saphenous vein reflux. Recommend compression and elevation for symptomatic relief. I do not think her symptoms can be explained by chronic venous insufficiency. Follow up with me as needed.  CHIEF COMPLAINT: Right leg pain  HISTORY OF PRESENT ILLNESS: Tracye Szuch is a 69 y.o. female referred to clinic for evaluation of chronic venous insufficiency.  The patient reports a several month history of radiating right lower extremity discomfort originating in the buttock and hip.  Down the posterior aspect of the leg to the calf.  The pain is exacerbated by seated position.  Patient does not get much relief from position changes.  The patient did notice some relief from a steroid Dosepak.  She was referred to neurosurgery for evaluation of possible bulging disc.  She was referred to me for evaluation of chronic venous insufficiency.  Patient does not have any symptoms typical of chronic venous insufficiency.  She is obese, and does have scattered reticular veins about the leg.  She does not have venous claudication, inflammatory skin changes, or venous ulceration.  Past Medical History:  Diagnosis Date   Anemia    vitamin D deficiency "years ago)   Medical history non-contributory    Pneumonia     Past Surgical History:  Procedure Laterality Date   ANKLE SURGERY     BILATERAL SALPINGECTOMY Bilateral 10/21/2015   Procedure: BILATERAL SALPINGECTOMY;  Surgeon: Myna Hidalgo, DO;  Location: [facility];  Service: Gynecology;  Laterality: Bilateral;   CARPAL TUNNEL RELEASE     Left wrist   CARPAL TUNNEL RELEASE Right 04/29/2020   Procedure: RIGHT CARPAL TUNNEL RELEASE;  Surgeon: Tarry Kos, MD;  Location: [facility];  Service: Orthopedics;  Laterality: Right;    CESAREAN SECTION     CHOLECYSTECTOMY     COLONOSCOPY     DILATION AND CURETTAGE OF UTERUS     LAPAROSCOPIC VAGINAL HYSTERECTOMY WITH SALPINGECTOMY Bilateral 10/21/2015   Procedure: LAPAROSCOPIC ASSISTED VAGINAL HYSTERECTOMY ;  Surgeon: Myna Hidalgo, DO;  Location: [facility];  Service: Gynecology;  Laterality: Bilateral;   PILONIDAL CYST EXCISION      History reviewed. No pertinent family history.  Social History   Socioeconomic History   Marital status: Married    Spouse name: Not on file   Number of children: Not on file   Years of education: Not on file   Highest education level: Not on file  Occupational History   Not on file  Tobacco Use   Smoking status: Never   Smokeless tobacco: Never  Substance and Sexual Activity   Alcohol use: No   Drug use: No   Sexual activity: Yes    Birth control/protection: Post-menopausal  Other Topics Concern   Not on file  Social History Narrative   Not on file   Social Drivers of Health   Financial Resource Strain: Not on file  Food Insecurity: Not on file  Transportation Needs: Not on file  Physical Activity: Not on file  Stress: Not on file  Social Connections: Not on file  Intimate Partner Violence: Not on file    Allergies  Allergen Reactions   Bee Venom Swelling    Current Outpatient Medications  Medication Sig Dispense Refill   acetaminophen (TYLENOL) 500 MG tablet Take 500 mg by mouth every 6 (six) hours  as needed.     ibuprofen (ADVIL) 800 MG tablet 1 tablet with food or milk as needed Orally every 8 hrs     No current facility-administered medications for this visit.    PHYSICAL EXAM Vitals:   01/30/24 1416  BP: (!) 179/109  Pulse: 78  Temp: 98.4 F (36.9 C)  SpO2: 96%  Weight: 298 lb 1.6 oz (135.2 kg)  Height: 5\' 3"  (1.6 m)   Morbidly obese woman in no distress Regular rate and rhythm Unlabored breathing Palpable dorsalis pedis pulses bilaterally Scattered reticular veins across the right lower  extremity  PERTINENT LABORATORY AND RADIOLOGIC DATA  Most recent CBC    Latest Ref Rng & Units 04/29/2020   10:07 AM 10/24/2015   10:45 AM 10/22/2015    5:43 AM  CBC  WBC 4.0 - 10.5 K/uL 7.6  25.2  15.4   Hemoglobin 12.0 - 15.0 g/dL 78.4  69.6  29.5   Hematocrit 36.0 - 46.0 % 46.2  36.1  34.0   Platelets 150 - 400 K/uL 372  484  418      Most recent CMP    Latest Ref Rng & Units 10/24/2015   10:45 AM 10/22/2015    5:43 AM 10/09/2015    9:10 AM  CMP  Glucose 65 - 99 mg/dL 284  132  440   BUN 6 - 20 mg/dL 11  12  13    Creatinine 0.44 - 1.00 mg/dL 1.02  7.25  3.66   Sodium 135 - 145 mmol/L 140  136  141   Potassium 3.5 - 5.1 mmol/L 4.3  3.7  4.7   Chloride 101 - 111 mmol/L 104  102  107   CO2 22 - 32 mmol/L 25  25  25    Calcium 8.9 - 10.3 mg/dL 9.3  8.8  9.5   Total Protein 6.5 - 8.1 g/dL 6.4   8.5   Total Bilirubin 0.3 - 1.2 mg/dL 0.8   0.6   Alkaline Phos 38 - 126 U/L 95   57   AST 15 - 41 U/L 34   22   ALT 14 - 54 U/L 82   25    Right lower extremity venous reflux study:  - No evidence of deep vein thrombosis seen in the right lower extremity,  from the common femoral through the popliteal veins.  - No evidence of superficial venous thrombosis in the right lower  extremity.    - The deep venous system is incompetent at the common femoral and mid  femoral veins.  - The great saphenous vein is incompetent at the saphenofemoral junction.  - The small saphenous vein is competent.   Rande Brunt. Lenell Antu, MD [facility] Phone Number: [phone] 01/30/2024 5:03 PM   Total time spent on preparing this encounter including chart review, data review, collecting history, examining the patient, coordinating care for this new patient, 60 minutes.  Portions of this report may have been transcribed using voice recognition software.  Every effort has been made to ensure accuracy; however, inadvertent computerized transcription errors may  still be present.

## 2024-02-29 ENCOUNTER — Ambulatory Visit: Admitting: Orthopedic Surgery

## 2024-02-29 ENCOUNTER — Encounter: Payer: Self-pay | Admitting: Orthopedic Surgery

## 2024-02-29 DIAGNOSIS — M5410 Radiculopathy, site unspecified: Secondary | ICD-10-CM | POA: Diagnosis not present

## 2024-02-29 DIAGNOSIS — M51361 Other intervertebral disc degeneration, lumbar region with lower extremity pain only: Secondary | ICD-10-CM

## 2024-02-29 DIAGNOSIS — M545 Low back pain, unspecified: Secondary | ICD-10-CM

## 2024-02-29 NOTE — Progress Notes (Signed)
 Office Visit Note   Patient: Tina Valdez           Date of Birth: September 29, 1956           MRN: [id number] Visit Date: 02/29/2024              Requested by: Lorenza Romans, FNP [address] [place],  [place] [address] PCP: [place], Betty G, MD  Chief Complaint  Patient presents with   Right Leg - Pain      HPI: Patient is a 68 year old woman who was seen for initial evaluation for radicular pain right lower extremity.  Patient states the pain starts in the lateral right thigh and radiates down the lateral right calf.  Patient states the pain is worse with sitting.  Patient states she has taken prednisone and feels like the pain was completely resolved with prednisone.  Patient has had an ultrasound that was negative for DVT she states she has cramping muscle pain at night.  Patient states she has seen neurosurgery and was told she had a hamstring tear.  Assessment & Plan: Visit Diagnoses:  1. Low back pain, unspecified back pain laterality, unspecified chronicity, unspecified whether sciatica present   2. Radicular pain of right lower extremity   3. Degeneration of intervertebral disc of lumbar region with lower extremity pain     Plan: Will have patient follow-up with Dr. Daisey Dryer for evaluation for transforaminal injection.  Follow-Up Instructions: No follow-ups on file.   Ortho Exam  Patient is alert, oriented, no adenopathy, well-dressed, normal affect, normal respiratory effort. Examination patient has a positive straight leg raise on the right.  No focal motor weakness with good hip flexion EHL strength and dorsiflexion strength at the ankle.  She does have venous swelling with no tenderness medially no evidence of DVT.  Reviewing her MRI scan shows decreased hydration of all lumbar spine discs with posterior bulging of the lumbar disks.  Imaging: No results found. No images are attached to the encounter.  Labs: No results found for: "HGBA1C", "ESRSEDRATE", "CRP",  "LABURIC", "REPTSTATUS", "GRAMSTAIN", "CULT", "LABORGA"   Lab Results  Component Value Date   ALBUMIN 3.0 (L) 10/24/2015   ALBUMIN 4.1 10/09/2015    No results found for: "MG" No results found for: "VD25OH"  No results found for: "PREALBUMIN"    Latest Ref Rng & Units 04/29/2020   10:07 AM 10/24/2015   10:45 AM 10/22/2015    5:43 AM  CBC EXTENDED  WBC 4.0 - 10.5 K/uL 7.6  25.2  15.4   RBC 3.87 - 5.11 MIL/uL 5.14  4.17  3.88   Hemoglobin 12.0 - 15.0 g/dL 19.1  47.8  29.5   HCT 36.0 - 46.0 % 46.2  36.1  34.0   Platelets 150 - 400 K/uL 372  484  418   NEUT# 1.7 - 7.7 K/uL  22.2    Lymph# 0.7 - 4.0 K/uL  1.0       There is no height or weight on file to calculate BMI.  Orders:  Orders Placed This Encounter  Procedures   Ambulatory referral to Physical Medicine Rehab   No orders of the defined types were placed in this encounter.    Procedures: No procedures performed  Clinical Data: No additional findings.  ROS:  All other systems negative, except as noted in the HPI. Review of Systems  Objective: Vital Signs: There were no vitals taken for this visit.  Specialty Comments:  MRI LUMBAR SPINE WITHOUT  CONTRAST   TECHNIQUE: Multiplanar, multisequence MR imaging of the lumbar spine was performed. No intravenous contrast was administered.   COMPARISON:  None Available.   FINDINGS: Segmentation:  Standard.   Alignment:  Physiologic.   Vertebrae: No fracture, evidence of discitis, or bone lesion. Degenerative endplate changes of the inferior and superior endplates of L3 on L4   Conus medullaris and cauda equina: Conus extends to the L2 level. Conus and cauda equina appear normal.   Paraspinal and other soft tissues: Prominent nonspecific lymph nodes of the aortic bifurcation measuring up to 9 mm. These may be reactive.   Disc levels:   T12-L1: Mild left-sided facet degenerative change. No spinal canal narrowing. No neural foraminal narrowing    L1-L2: Mild bilateral facet degenerative change. No significant disc bulge. No spinal canal narrowing. Neural foraminal narrowing   L2-L3: Mild bilateral facet degenerative change. No significant disc bulge. Spinal canal narrowing. Neural foraminal narrowing   L3-L4: Mild-to-moderate left and mild right facet degenerative change. No significant disc bulge. No spinal canal narrowing. No significant neural foraminal narrowing.   L4-L5: Mild bilateral facet degenerative change. No significant disc bulge. Spinal canal narrowing. Mild right foraminal narrowing.   L5-S1: Moderate left and mild right neural foraminal narrowing. Eccentric left disc bulge. The extraforaminal component of the disc bulge may contact the exited left L5 nerve root no spinal canal narrowing. Mild bilateral neural foraminal narrowing. There is mild narrowing of the left lateral recess.   IMPRESSION: 1. No acute fracture or traumatic listhesis. 2. Lower lumbar spine predominant degenerative changes, most notably at L5-S1, where there is an eccentric left disc bulge that narrows the left lateral recess and may also contact the exited left L5 nerve root in the extraforaminal zone.     Electronically Signed   By: Clora Dane M.D.   On: 11/27/2023 06:58  PMFS History: Patient Active Problem List   Diagnosis Date Noted   Carpal tunnel syndrome on right 04/28/2020   Postmenopausal bleeding 10/21/2015   Past Medical History:  Diagnosis Date   Anemia    vitamin D deficiency "years ago)   Medical history non-contributory    Pneumonia     History reviewed. No pertinent family history.  Past Surgical History:  Procedure Laterality Date   ANKLE SURGERY     BILATERAL SALPINGECTOMY Bilateral 10/21/2015   Procedure: BILATERAL SALPINGECTOMY;  Surgeon: Keene Pastures, DO;  Location: [facility];  Service: Gynecology;  Laterality: Bilateral;   CARPAL TUNNEL RELEASE     Left wrist   CARPAL TUNNEL RELEASE Right  04/29/2020   Procedure: RIGHT CARPAL TUNNEL RELEASE;  Surgeon: Wes Hamman, MD;  Location: [facility];  Service: Orthopedics;  Laterality: Right;   CESAREAN SECTION     CHOLECYSTECTOMY     COLONOSCOPY     DILATION AND CURETTAGE OF UTERUS     LAPAROSCOPIC VAGINAL HYSTERECTOMY WITH SALPINGECTOMY Bilateral 10/21/2015   Procedure: LAPAROSCOPIC ASSISTED VAGINAL HYSTERECTOMY ;  Surgeon: Keene Pastures, DO;  Location: [facility];  Service: Gynecology;  Laterality: Bilateral;   PILONIDAL CYST EXCISION     Social History   Occupational History   Not on file  Tobacco Use   Smoking status: Never   Smokeless tobacco: Never  Substance and Sexual Activity   Alcohol use: No   Drug use: No   Sexual activity: Yes    Birth control/protection: Post-menopausal

## 2024-03-07 ENCOUNTER — Ambulatory Visit (INDEPENDENT_AMBULATORY_CARE_PROVIDER_SITE_OTHER): Admitting: Physical Medicine and Rehabilitation

## 2024-03-07 DIAGNOSIS — M5441 Lumbago with sciatica, right side: Secondary | ICD-10-CM | POA: Diagnosis not present

## 2024-03-07 DIAGNOSIS — G8929 Other chronic pain: Secondary | ICD-10-CM | POA: Diagnosis not present

## 2024-03-07 DIAGNOSIS — M79604 Pain in right leg: Secondary | ICD-10-CM | POA: Diagnosis not present

## 2024-03-07 DIAGNOSIS — M5416 Radiculopathy, lumbar region: Secondary | ICD-10-CM

## 2024-03-07 NOTE — Progress Notes (Signed)
 Tina Valdez - 68 y.o. female MRN [id number]  Date of birth: 09/10/1956  Office Visit Note: Visit Date: 03/07/2024 PCP: [place], Betty G, MD Referred by: [place], Betty G, MD  Subjective: Chief Complaint  Patient presents with   Lower Back - Pain   HPI: Tina Valdez is a 68 y.o. female who comes in today per the request of Dr. Gearldean Keepers for evaluation of chronic, worsening and severe bilateral lower back pain radiating down right posterolateral leg to ankle. Pain ongoing since January, worsens with standing and walking. She describes pain as sharp and stabbing sensation, currently rates as 8 out of 10. Some relief of pain with home exercise regimen, rest and use of medications. Some relief of pain with oral Prednisone. Minimal relief of pain with formal physical therapy.  She was evaluated by Dr. Runell Countryman with vascular surgery, per his notes he did not feel her symptoms are related to chronic venous insufficiency. Right lower extremity doppler in March was negative for DVT. Lumbar MRI imaging from February of this year shows degenerative changes to lower lumbar spine, there is left sided disc bulge at L5-S1 narrowing the lateral recess and could contact exiting left L5 nerve root. She was also evaluated by Dr. Agustina Aldrich at [facility], diagnosed with strain of right hamstring muscle. Patient denies focal weakness, numbness and tingling. No recent trauma or falls.         Review of Systems  Musculoskeletal:  Positive for back pain.  Neurological:  Negative for tingling, sensory change, focal weakness and weakness.  All other systems reviewed and are negative.  Otherwise per HPI.  Assessment & Plan: Visit Diagnoses:    ICD-10-CM   1. Chronic bilateral low back pain with right-sided sciatica  G89.29 Ambulatory referral to Physical Medicine Rehab   M54.41     2. Radiculopathy, lumbar region  M54.16 Ambulatory referral to Physical Medicine Rehab    3. Pain in  right leg  M79.604 Ambulatory referral to Physical Medicine Rehab       Plan: Findings:  Chronic, worsening and severe bilateral lower back pain radiating down right posterolateral leg down to ankle. Patient continues to have severe pain despite good conservative therapies such as formal physical therapy, home exercise regimen, rest and use of medications. Patients clinical presentation and exam are complex, differentials include lumbar radiculopathy, her pain pattern does fit with more L5 and S1 distribution. Could also be more of an intrinsic leg issue. She is diffusely tender upon light palpation of right leg today. Recent lumbar MRI imaging shows mostly left sided findings, which does not specifically fit with her pain pattern. We discussed treatment plan in detail today, including lumbar epidural steroid injection. Next step is to perform diagnostic and hopefully therapeutic right S1 transforaminal epidural steroid injection under fluoroscopic guidance. If good relief of pain with injection we can repeat this procedure infrequently as needed. I discussed injection procedure in detail, she has no questions at this time. No red flag symptoms noted upon exam today.     Meds & Orders: No orders of the defined types were placed in this encounter.   Orders Placed This Encounter  Procedures   Ambulatory referral to Physical Medicine Rehab    Follow-up: Return for Right S1 transforaminal epidural steroid injection.   Procedures: No procedures performed      Clinical History: MRI LUMBAR SPINE WITHOUT CONTRAST   TECHNIQUE: Multiplanar, multisequence MR imaging of the lumbar spine was performed. No intravenous  contrast was administered.   COMPARISON:  None Available.   FINDINGS: Segmentation:  Standard.   Alignment:  Physiologic.   Vertebrae: No fracture, evidence of discitis, or bone lesion. Degenerative endplate changes of the inferior and superior endplates of L3 on L4   Conus  medullaris and cauda equina: Conus extends to the L2 level. Conus and cauda equina appear normal.   Paraspinal and other soft tissues: Prominent nonspecific lymph nodes of the aortic bifurcation measuring up to 9 mm. These may be reactive.   Disc levels:   T12-L1: Mild left-sided facet degenerative change. No spinal canal narrowing. No neural foraminal narrowing   L1-L2: Mild bilateral facet degenerative change. No significant disc bulge. No spinal canal narrowing. Neural foraminal narrowing   L2-L3: Mild bilateral facet degenerative change. No significant disc bulge. Spinal canal narrowing. Neural foraminal narrowing   L3-L4: Mild-to-moderate left and mild right facet degenerative change. No significant disc bulge. No spinal canal narrowing. No significant neural foraminal narrowing.   L4-L5: Mild bilateral facet degenerative change. No significant disc bulge. Spinal canal narrowing. Mild right foraminal narrowing.   L5-S1: Moderate left and mild right neural foraminal narrowing. Eccentric left disc bulge. The extraforaminal component of the disc bulge may contact the exited left L5 nerve root no spinal canal narrowing. Mild bilateral neural foraminal narrowing. There is mild narrowing of the left lateral recess.   IMPRESSION: 1. No acute fracture or traumatic listhesis. 2. Lower lumbar spine predominant degenerative changes, most notably at L5-S1, where there is an eccentric left disc bulge that narrows the left lateral recess and may also contact the exited left L5 nerve root in the extraforaminal zone.     Electronically Signed   By: Clora Dane M.D.   On: 11/27/2023 06:58   She reports that she has never smoked. She has never used smokeless tobacco. No results for input(s): "HGBA1C", "LABURIC" in the last 8760 hours.  Objective:  VS:  HT:    WT:   BMI:     BP:   HR: bpm  TEMP: ( )  RESP:  Physical Exam Vitals and nursing note reviewed.  HENT:     Head:  Normocephalic and atraumatic.     Right Ear: External ear normal.     Left Ear: External ear normal.     Nose: Nose normal.     Mouth/Throat:     Mouth: Mucous membranes are moist.  Eyes:     Extraocular Movements: Extraocular movements intact.  Cardiovascular:     Rate and Rhythm: Normal rate.     Pulses: Normal pulses.  Pulmonary:     Effort: Pulmonary effort is normal.  Abdominal:     General: Abdomen is flat. There is no distension.  Musculoskeletal:        General: Tenderness present.     Cervical back: Normal range of motion.     Comments: Patient rises from seated position to standing without difficulty. Good lumbar range of motion. No pain noted with facet loading. 5/5 strength noted with bilateral hip flexion, knee flexion/extension, ankle dorsiflexion/plantarflexion and EHL. No clonus noted bilaterally. No pain upon palpation of greater trochanters. No pain with internal/external rotation of bilateral hips. Sensation intact bilaterally. Dysesthesias noted to right L5 and S1 dermatomes. Negative slump test bilaterally. Ambulates without aid, gait steady.     Skin:    General: Skin is warm and dry.     Capillary Refill: Capillary refill takes less than 2 seconds.  Neurological:  General: No focal deficit present.     Mental Status: She is alert and oriented to person, place, and time.  Psychiatric:        Mood and Affect: Mood normal.        Behavior: Behavior normal.     Ortho Exam  Imaging: No results found.  Past Medical/Family/Surgical/Social History: Medications & Allergies reviewed per EMR, new medications updated. Patient Active Problem List   Diagnosis Date Noted   Carpal tunnel syndrome on right 04/28/2020   Postmenopausal bleeding 10/21/2015   Past Medical History:  Diagnosis Date   Anemia    vitamin D deficiency "years ago)   Medical history non-contributory    Pneumonia    History reviewed. No pertinent family history. Past Surgical History:   Procedure Laterality Date   ANKLE SURGERY     BILATERAL SALPINGECTOMY Bilateral 10/21/2015   Procedure: BILATERAL SALPINGECTOMY;  Surgeon: Keene Pastures, DO;  Location: [facility];  Service: Gynecology;  Laterality: Bilateral;   CARPAL TUNNEL RELEASE     Left wrist   CARPAL TUNNEL RELEASE Right 04/29/2020   Procedure: RIGHT CARPAL TUNNEL RELEASE;  Surgeon: Wes Hamman, MD;  Location: [facility];  Service: Orthopedics;  Laterality: Right;   CESAREAN SECTION     CHOLECYSTECTOMY     COLONOSCOPY     DILATION AND CURETTAGE OF UTERUS     LAPAROSCOPIC VAGINAL HYSTERECTOMY WITH SALPINGECTOMY Bilateral 10/21/2015   Procedure: LAPAROSCOPIC ASSISTED VAGINAL HYSTERECTOMY ;  Surgeon: Keene Pastures, DO;  Location: [facility];  Service: Gynecology;  Laterality: Bilateral;   PILONIDAL CYST EXCISION     Social History   Occupational History   Not on file  Tobacco Use   Smoking status: Never   Smokeless tobacco: Never  Substance and Sexual Activity   Alcohol use: No   Drug use: No   Sexual activity: Yes    Birth control/protection: Post-menopausal

## 2024-03-07 NOTE — Progress Notes (Signed)
 Pain Scale   Average Pain 6 Patient advised she has chronic lower back pain radiating to her right leg.        +Driver, -BT, -Dye Allergies.

## 2024-03-08 ENCOUNTER — Encounter: Payer: Self-pay | Admitting: Physical Medicine and Rehabilitation

## 2024-03-25 ENCOUNTER — Other Ambulatory Visit: Payer: Self-pay

## 2024-03-25 ENCOUNTER — Ambulatory Visit (INDEPENDENT_AMBULATORY_CARE_PROVIDER_SITE_OTHER): Admitting: Physical Medicine and Rehabilitation

## 2024-03-25 VITALS — BP 159/83 | HR 94

## 2024-03-25 DIAGNOSIS — M5416 Radiculopathy, lumbar region: Secondary | ICD-10-CM

## 2024-03-25 MED ORDER — METHYLPREDNISOLONE ACETATE 40 MG/ML IJ SUSP
40.0000 mg | Freq: Once | INTRAMUSCULAR | Status: AC
  Administered 2024-03-25: 40 mg

## 2024-03-25 NOTE — Progress Notes (Signed)
 Pain Scale   Average Pain 7 Patient advising her lower back pain is chronic and at time ir radiates to her Right hip area.        +Driver, -BT, -Dye Allergies.

## 2024-03-25 NOTE — Procedures (Signed)
 S1 Lumbosacral Transforaminal Epidural Steroid Injection - Sub-Pedicular Approach with Fluoroscopic Guidance   Patient: Tina Valdez      Date of Birth: 08-Nov-1955 MRN: [id number] PCP: [place], Betty G, MD      Visit Date: 03/25/2024   Universal Protocol:    Date/Time: 06/02/252:49 PM  Consent Given By: the patient  Position:  PRONE  Additional Comments: Vital signs were monitored before and after the procedure. Patient was prepped and draped in the usual sterile fashion. The correct patient, procedure, and site was verified.   Injection Procedure Details:  Procedure Site One Meds Administered:  Meds ordered this encounter  Medications   methylPREDNISolone  acetate (DEPO-MEDROL ) injection 40 mg    Laterality: Right  Location/Site:  S1 Foramen   Needle size: 22 ga.  Needle type: Spinal  Needle Placement: Transforaminal  Findings:   -Comments: Excellent flow of contrast along the nerve, nerve root and into the epidural space.  Epidurogram: Contrast epidurogram showed no nerve root cut off or restricted flow pattern.  Procedure Details: After squaring off the sacral end-plate to get a true AP view, the C-arm was positioned so that the best possible view of the S1 foramen was visualized. The soft tissues overlying this structure were infiltrated with 2-3 ml. of 1% Lidocaine  without Epinephrine .    The spinal needle was inserted toward the target using a "trajectory" view along the fluoroscope beam.  Under AP and lateral visualization, the needle was advanced so it did not puncture dura. Biplanar projections were used to confirm position. Aspiration was confirmed to be negative for CSF and/or blood. A 1-2 ml. volume of Isovue-250 was injected and flow of contrast was noted at each level. Radiographs were obtained for documentation purposes.   After attaining the desired flow of contrast documented above, a 0.5 to 1.0 ml test dose of 0.25% Marcaine  was injected into each  respective transforaminal space.  The patient was observed for 90 seconds post injection.  After no sensory deficits were reported, and normal lower extremity motor function was noted,   the above injectate was administered so that equal amounts of the injectate were placed at each foramen (level) into the transforaminal epidural space.   Additional Comments:  The patient tolerated the procedure well Dressing: Band-Aid with 2 x 2 sterile gauze    Post-procedure details: Patient was observed during the procedure. Post-procedure instructions were reviewed.  Patient left the clinic in stable condition.

## 2024-03-25 NOTE — Patient Instructions (Signed)

## 2024-03-25 NOTE — Progress Notes (Signed)
 Tina Valdez - 68 y.o. female MRN [id number]  Date of birth: 03-Nov-1955  Office Visit Note: Visit Date: 03/25/2024 PCP: [place], Betty G, MD Referred by: [place], Betty G, MD  Subjective: Chief Complaint  Patient presents with   Lower Back - Pain   HPI:  Tina Valdez is a 68 y.o. female who comes in today at the request of Elvan Hamel, FNP for planned Right S1-2 Lumbar Transforaminal epidural steroid injection with fluoroscopic guidance.  The patient has failed conservative care including home exercise, medications, time and activity modification.  This injection will be diagnostic and hopefully therapeutic.  Please see requesting physician notes for further details and justification.   ROS Otherwise per HPI.  Assessment & Plan: Visit Diagnoses:    ICD-10-CM   1. Radiculopathy, lumbar region  M54.16 XR C-ARM NO REPORT    Epidural Steroid injection    methylPREDNISolone  acetate (DEPO-MEDROL ) injection 40 mg      Plan: No additional findings.   Meds & Orders:  Meds ordered this encounter  Medications   methylPREDNISolone  acetate (DEPO-MEDROL ) injection 40 mg    Orders Placed This Encounter  Procedures   XR C-ARM NO REPORT   Epidural Steroid injection    Follow-up: Return for visit to requesting provider as needed.   Procedures: No procedures performed  S1 Lumbosacral Transforaminal Epidural Steroid Injection - Sub-Pedicular Approach with Fluoroscopic Guidance   Patient: Tina Valdez      Date of Birth: 01-10-1956 MRN: [id number] PCP: [place], Betty G, MD      Visit Date: 03/25/2024   Universal Protocol:    Date/Time: 06/02/252:49 PM  Consent Given By: the patient  Position:  PRONE  Additional Comments: Vital signs were monitored before and after the procedure. Patient was prepped and draped in the usual sterile fashion. The correct patient, procedure, and site was verified.   Injection Procedure Details:  Procedure Site One Meds Administered:  Meds ordered  this encounter  Medications   methylPREDNISolone  acetate (DEPO-MEDROL ) injection 40 mg    Laterality: Right  Location/Site:  S1 Foramen   Needle size: 22 ga.  Needle type: Spinal  Needle Placement: Transforaminal  Findings:   -Comments: Excellent flow of contrast along the nerve, nerve root and into the epidural space.  Epidurogram: Contrast epidurogram showed no nerve root cut off or restricted flow pattern.  Procedure Details: After squaring off the sacral end-plate to get a true AP view, the C-arm was positioned so that the best possible view of the S1 foramen was visualized. The soft tissues overlying this structure were infiltrated with 2-3 ml. of 1% Lidocaine  without Epinephrine .    The spinal needle was inserted toward the target using a "trajectory" view along the fluoroscope beam.  Under AP and lateral visualization, the needle was advanced so it did not puncture dura. Biplanar projections were used to confirm position. Aspiration was confirmed to be negative for CSF and/or blood. A 1-2 ml. volume of Isovue-250 was injected and flow of contrast was noted at each level. Radiographs were obtained for documentation purposes.   After attaining the desired flow of contrast documented above, a 0.5 to 1.0 ml test dose of 0.25% Marcaine  was injected into each respective transforaminal space.  The patient was observed for 90 seconds post injection.  After no sensory deficits were reported, and normal lower extremity motor function was noted,   the above injectate was administered so that equal amounts of the injectate were placed at each foramen (level) into the transforaminal epidural  space.   Additional Comments:  The patient tolerated the procedure well Dressing: Band-Aid with 2 x 2 sterile gauze    Post-procedure details: Patient was observed during the procedure. Post-procedure instructions were reviewed.  Patient left the clinic in stable condition.    Clinical  History: MRI LUMBAR SPINE WITHOUT CONTRAST   TECHNIQUE: Multiplanar, multisequence MR imaging of the lumbar spine was performed. No intravenous contrast was administered.   COMPARISON:  None Available.   FINDINGS: Segmentation:  Standard.   Alignment:  Physiologic.   Vertebrae: No fracture, evidence of discitis, or bone lesion. Degenerative endplate changes of the inferior and superior endplates of L3 on L4   Conus medullaris and cauda equina: Conus extends to the L2 level. Conus and cauda equina appear normal.   Paraspinal and other soft tissues: Prominent nonspecific lymph nodes of the aortic bifurcation measuring up to 9 mm. These may be reactive.   Disc levels:   T12-L1: Mild left-sided facet degenerative change. No spinal canal narrowing. No neural foraminal narrowing   L1-L2: Mild bilateral facet degenerative change. No significant disc bulge. No spinal canal narrowing. Neural foraminal narrowing   L2-L3: Mild bilateral facet degenerative change. No significant disc bulge. Spinal canal narrowing. Neural foraminal narrowing   L3-L4: Mild-to-moderate left and mild right facet degenerative change. No significant disc bulge. No spinal canal narrowing. No significant neural foraminal narrowing.   L4-L5: Mild bilateral facet degenerative change. No significant disc bulge. Spinal canal narrowing. Mild right foraminal narrowing.   L5-S1: Moderate left and mild right neural foraminal narrowing. Eccentric left disc bulge. The extraforaminal component of the disc bulge may contact the exited left L5 nerve root no spinal canal narrowing. Mild bilateral neural foraminal narrowing. There is mild narrowing of the left lateral recess.   IMPRESSION: 1. No acute fracture or traumatic listhesis. 2. Lower lumbar spine predominant degenerative changes, most notably at L5-S1, where there is an eccentric left disc bulge that narrows the left lateral recess and may also contact the  exited left L5 nerve root in the extraforaminal zone.     Electronically Signed   By: Clora Dane M.D.   On: 11/27/2023 06:58     Objective:  VS:  HT:    WT:   BMI:     BP:(!) 159/83  HR:94bpm  TEMP: ( )  RESP:  Physical Exam Vitals and nursing note reviewed.  Constitutional:      General: She is not in acute distress.    Appearance: Normal appearance. She is not ill-appearing.  HENT:     Head: Normocephalic and atraumatic.     Right Ear: External ear normal.     Left Ear: External ear normal.  Eyes:     Extraocular Movements: Extraocular movements intact.  Cardiovascular:     Rate and Rhythm: Normal rate.     Pulses: Normal pulses.  Pulmonary:     Effort: Pulmonary effort is normal. No respiratory distress.  Abdominal:     General: There is no distension.     Palpations: Abdomen is soft.  Musculoskeletal:        General: Tenderness present.     Cervical back: Neck supple.     Right lower leg: No edema.     Left lower leg: No edema.     Comments: Patient has good distal strength with no pain over the greater trochanters.  No clonus or focal weakness.  Skin:    Findings: No erythema, lesion or rash.  Neurological:  General: No focal deficit present.     Mental Status: She is alert and oriented to person, place, and time.     Sensory: No sensory deficit.     Motor: No weakness or abnormal muscle tone.     Coordination: Coordination normal.  Psychiatric:        Mood and Affect: Mood normal.        Behavior: Behavior normal.      Imaging: No results found.

## 2024-08-16 ENCOUNTER — Telehealth: Payer: Self-pay | Admitting: Physical Medicine and Rehabilitation

## 2024-08-16 NOTE — Telephone Encounter (Signed)
 Pt called requesting an appt for injection. Last injection 03/25/24. Pt phone number is [phone].

## 2024-08-22 ENCOUNTER — Ambulatory Visit: Admitting: Physical Medicine and Rehabilitation

## 2024-08-22 ENCOUNTER — Encounter: Payer: Self-pay | Admitting: Physical Medicine and Rehabilitation

## 2024-08-22 DIAGNOSIS — G8929 Other chronic pain: Secondary | ICD-10-CM | POA: Diagnosis not present

## 2024-08-22 DIAGNOSIS — M7918 Myalgia, other site: Secondary | ICD-10-CM | POA: Diagnosis not present

## 2024-08-22 DIAGNOSIS — M47817 Spondylosis without myelopathy or radiculopathy, lumbosacral region: Secondary | ICD-10-CM

## 2024-08-22 DIAGNOSIS — M545 Low back pain, unspecified: Secondary | ICD-10-CM

## 2024-08-22 MED ORDER — MELOXICAM 15 MG PO TABS: 15.0000 mg | ORAL_TABLET | Freq: Every day | ORAL | 0 refills | Status: DC

## 2024-08-22 MED ORDER — TIZANIDINE HCL 4 MG PO TABS: 4.0000 mg | ORAL_TABLET | Freq: Every evening | ORAL | 0 refills | Status: DC | PRN

## 2024-08-22 NOTE — Progress Notes (Unsigned)
 Tina Valdez - 68 y.o. female MRN [id number]  Date of birth: Dec 12, 1955  Office Visit Note: Visit Date: 08/22/2024 PCP: Jordan, Betty G, MD Referred by: Jordan, Betty G, MD  Subjective: Chief Complaint  Patient presents with   Lower Back - Pain   HPI: Tina Valdez is a 68 y.o. female who comes in today for evaluation of chronic, worsening and severe bilateral lower back pain. Pain started several months ago. Her pain is constant, severe pain with sitting, standing and sleeping. She describes pain as sharp and stabbing sensation, currently rates as 10 out of 10. Some relief of pain with home exercise regimen, rest and use of medications. Some relief of pain with Tylenol  and Aleve. No history of formal physical therapy. Lumbar MRI imaging from February of this year shows degenerative changes to lower lumbar spine, there is left sided disc bulge at L5-S1 narrowing the lateral recess and could contact exiting left L5 nerve root. She underwent right S1 transforaminal epidural steroid injection in our office on 03/25/2024. She reports complete resolution of right sided radicular symptoms. Patient denies focal weakness, numbness and tingling. No recent trauma or falls.      Review of Systems  Musculoskeletal:  Positive for back pain and myalgias.  Neurological:  Negative for tingling, sensory change, focal weakness and weakness.  All other systems reviewed and are negative.  Otherwise per HPI.  Assessment & Plan: Visit Diagnoses:    ICD-10-CM   1. Chronic bilateral low back pain without sciatica  M54.50 Ambulatory referral to Physical Therapy   G89.29     2. Spondylosis without myelopathy or radiculopathy, lumbosacral region  M47.817 Ambulatory referral to Physical Therapy    3. Myofascial pain syndrome  M79.18 Ambulatory referral to Physical Therapy       Plan: Findings:  Chronic, worsening and severe bilateral lower back pain. No radicular pain down the legs. Patient continues to have  severe pain despite good conservative therapies such as home exercise regimen, rest and use of medications. Patients clinical presentation and exam are consistent with myofascial pain syndrome. Myofascial tenderness noted upon palpation of bilateral lumbar paraspinal regions upon palpation. We discussed treatment plan in detail today. Next step is to place order for short course of formal physical therapy. I do think she would benefit from manual treatments and dry needling. We also discussed medication management, I prescribed meloxicam and tizanidine. I would like to see her back in approximately 8 weeks post physical therapy. No red flag symptoms noted upon exam today.     Meds & Orders:  Meds ordered this encounter  Medications   meloxicam (MOBIC) 15 MG tablet    Sig: Take 1 tablet (15 mg total) by mouth daily.    Dispense:  60 tablet    Refill:  0   tiZANidine (ZANAFLEX) 4 MG tablet    Sig: Take 1 tablet (4 mg total) by mouth at bedtime as needed for muscle spasms.    Dispense:  60 tablet    Refill:  0    Orders Placed This Encounter  Procedures   Ambulatory referral to Physical Therapy    Follow-up: Return for 8 week follow up post physical therapy.   Procedures: No procedures performed      Clinical History: MRI LUMBAR SPINE WITHOUT CONTRAST   TECHNIQUE: Multiplanar, multisequence MR imaging of the lumbar spine was performed. No intravenous contrast was administered.   COMPARISON:  None Available.   FINDINGS: Segmentation:  Standard.   Alignment:  Physiologic.   Vertebrae: No fracture, evidence of discitis, or bone lesion. Degenerative endplate changes of the inferior and superior endplates of L3 on L4   Conus medullaris and cauda equina: Conus extends to the L2 level. Conus and cauda equina appear normal.   Paraspinal and other soft tissues: Prominent nonspecific lymph nodes of the aortic bifurcation measuring up to 9 mm. These may be reactive.   Disc  levels:   T12-L1: Mild left-sided facet degenerative change. No spinal canal narrowing. No neural foraminal narrowing   L1-L2: Mild bilateral facet degenerative change. No significant disc bulge. No spinal canal narrowing. Neural foraminal narrowing   L2-L3: Mild bilateral facet degenerative change. No significant disc bulge. Spinal canal narrowing. Neural foraminal narrowing   L3-L4: Mild-to-moderate left and mild right facet degenerative change. No significant disc bulge. No spinal canal narrowing. No significant neural foraminal narrowing.   L4-L5: Mild bilateral facet degenerative change. No significant disc bulge. Spinal canal narrowing. Mild right foraminal narrowing.   L5-S1: Moderate left and mild right neural foraminal narrowing. Eccentric left disc bulge. The extraforaminal component of the disc bulge may contact the exited left L5 nerve root no spinal canal narrowing. Mild bilateral neural foraminal narrowing. There is mild narrowing of the left lateral recess.   IMPRESSION: 1. No acute fracture or traumatic listhesis. 2. Lower lumbar spine predominant degenerative changes, most notably at L5-S1, where there is an eccentric left disc bulge that narrows the left lateral recess and may also contact the exited left L5 nerve root in the extraforaminal zone.     Electronically Signed   By: Lyndall Gore M.D.   On: 11/27/2023 06:58   She reports that she has never smoked. She has never used smokeless tobacco. No results for input(s): HGBA1C, LABURIC in the last 8760 hours.  Objective:  VS:  HT:    WT:   BMI:     BP:   HR: bpm  TEMP: ( )  RESP:  Physical Exam Vitals and nursing note reviewed.  HENT:     Head: Normocephalic and atraumatic.     Right Ear: External ear normal.     Left Ear: External ear normal.     Nose: Nose normal.     Mouth/Throat:     Mouth: Mucous membranes are moist.  Eyes:     Pupils: Pupils are equal, round, and reactive to light.   Cardiovascular:     Rate and Rhythm: Normal rate.     Pulses: Normal pulses.  Pulmonary:     Effort: Pulmonary effort is normal.  Abdominal:     General: Abdomen is flat. There is no distension.  Musculoskeletal:        General: Tenderness present.     Cervical back: Normal range of motion.     Comments: Patient rises from seated position to standing without difficulty. Good lumbar range of motion. No pain noted with facet loading. 5/5 strength noted with bilateral hip flexion, knee flexion/extension, ankle dorsiflexion/plantarflexion and EHL. No clonus noted bilaterally. No pain upon palpation of greater trochanters. No pain with internal/external rotation of bilateral hips. Sensation intact bilaterally. Myofascial tenderness noted upon palpation of bilateral lumbar paraspinal regions. Negative slump test bilaterally. Ambulates without aid, gait steady.     Skin:    General: Skin is warm.     Capillary Refill: Capillary refill takes less than 2 seconds.  Neurological:     General: No focal deficit present.     Mental Status: She is alert  and oriented to person, place, and time.  Psychiatric:        Mood and Affect: Mood normal.        Behavior: Behavior normal.     Ortho Exam  Imaging: No results found.  Past Medical/Family/Surgical/Social History: Medications & Allergies reviewed per EMR, new medications updated. Patient Active Problem List   Diagnosis Date Noted   Carpal tunnel syndrome on right 04/28/2020   Postmenopausal bleeding 10/21/2015   Past Medical History:  Diagnosis Date   Anemia    vitamin D deficiency years ago)   Medical history non-contributory    Pneumonia    No family history on file. Past Surgical History:  Procedure Laterality Date   ANKLE SURGERY     BILATERAL SALPINGECTOMY Bilateral 10/21/2015   Procedure: BILATERAL SALPINGECTOMY;  Surgeon: Delon Prude, DO;  Location: [facility];  Service: Gynecology;  Laterality: Bilateral;   CARPAL TUNNEL  RELEASE     Left wrist   CARPAL TUNNEL RELEASE Right 04/29/2020   Procedure: RIGHT CARPAL TUNNEL RELEASE;  Surgeon: Jerri Kay HERO, MD;  Location: [facility];  Service: Orthopedics;  Laterality: Right;   CESAREAN SECTION     CHOLECYSTECTOMY     COLONOSCOPY     DILATION AND CURETTAGE OF UTERUS     LAPAROSCOPIC VAGINAL HYSTERECTOMY WITH SALPINGECTOMY Bilateral 10/21/2015   Procedure: LAPAROSCOPIC ASSISTED VAGINAL HYSTERECTOMY ;  Surgeon: Delon Prude, DO;  Location: [facility];  Service: Gynecology;  Laterality: Bilateral;   PILONIDAL CYST EXCISION     Social History   Occupational History   Not on file  Tobacco Use   Smoking status: Never   Smokeless tobacco: Never  Substance and Sexual Activity   Alcohol use: No   Drug use: No   Sexual activity: Yes    Birth control/protection: Post-menopausal

## 2024-08-22 NOTE — Progress Notes (Unsigned)
 Pain Scale   Average Pain 10 Patient advising the injection she had helped however the pain is still present and now in the right side..        +Driver, -BT, -Dye Allergies.

## 2024-08-26 ENCOUNTER — Encounter: Payer: Self-pay | Admitting: Radiology

## 2024-09-05 ENCOUNTER — Ambulatory Visit (INDEPENDENT_AMBULATORY_CARE_PROVIDER_SITE_OTHER): Admitting: Physical Therapy

## 2024-09-05 ENCOUNTER — Encounter: Payer: Self-pay | Admitting: Physical Therapy

## 2024-09-05 DIAGNOSIS — M5459 Other low back pain: Secondary | ICD-10-CM | POA: Diagnosis not present

## 2024-09-05 DIAGNOSIS — M6281 Muscle weakness (generalized): Secondary | ICD-10-CM

## 2024-09-05 DIAGNOSIS — R29898 Other symptoms and signs involving the musculoskeletal system: Secondary | ICD-10-CM

## 2024-09-05 NOTE — Therapy (Signed)
 OUTPATIENT PHYSICAL THERAPY EVALUATION   Patient Name: Raigen Jagielski MRN: [id number] DOB:09/24/1956, 68 y.o., female Today's Date: 09/05/2024  END OF SESSION:  PT End of Session - 09/05/24 0847     Visit Number 1    Number of Visits 12    Date for Recertification  10/17/24    Authorization Type Medicare    Progress Note Due on Visit 10    PT Start Time 0845    PT Stop Time 0923    PT Time Calculation (min) 38 min    Activity Tolerance Patient tolerated treatment well    Behavior During Therapy [facility] for tasks assessed/performed          Past Medical History:  Diagnosis Date   Anemia    vitamin D deficiency years ago)   Medical history non-contributory    Pneumonia    Past Surgical History:  Procedure Laterality Date   ANKLE SURGERY     BILATERAL SALPINGECTOMY Bilateral 10/21/2015   Procedure: BILATERAL SALPINGECTOMY;  Surgeon: Delon Prude, DO;  Location: [facility];  Service: Gynecology;  Laterality: Bilateral;   CARPAL TUNNEL RELEASE     Left wrist   CARPAL TUNNEL RELEASE Right 04/29/2020   Procedure: RIGHT CARPAL TUNNEL RELEASE;  Surgeon: Jerri Kay HERO, MD;  Location: [facility];  Service: Orthopedics;  Laterality: Right;   CESAREAN SECTION     CHOLECYSTECTOMY     COLONOSCOPY     DILATION AND CURETTAGE OF UTERUS     LAPAROSCOPIC VAGINAL HYSTERECTOMY WITH SALPINGECTOMY Bilateral 10/21/2015   Procedure: LAPAROSCOPIC ASSISTED VAGINAL HYSTERECTOMY ;  Surgeon: Delon Prude, DO;  Location: [facility];  Service: Gynecology;  Laterality: Bilateral;   PILONIDAL CYST EXCISION     Patient Active Problem List   Diagnosis Date Noted   Carpal tunnel syndrome on right 04/28/2020   Postmenopausal bleeding 10/21/2015    PCP: Jordan, Betty G, MD  REFERRING PROVIDER: Trudy Duwaine BRAVO, NP  REFERRING DIAG: [phone] (ICD-10-CM) - Chronic bilateral low back pain without sciatica M47.817 (ICD-10-CM) - Spondylosis without myelopathy or radiculopathy, lumbosacral region M79.18 (ICD-10-CM)  - Myofascial pain syndrome  Rationale for Evaluation and Treatment: Rehabilitation  THERAPY DIAG:  Other low back pain - Plan: PT plan of care cert/re-cert  Muscle weakness (generalized) - Plan: PT plan of care cert/re-cert  Other symptoms and signs involving the musculoskeletal system - Plan: PT plan of care cert/re-cert  ONSET DATE: Aug 2025   SUBJECTIVE:  SUBJECTIVE STATEMENT: Elianah Karis is a 68 y.o. female who comes in today for evaluation of chronic, worsening and severe bilateral lower back pain. Pain started several months ago, no known injury. Her pain is constant, severe pain with sitting, standing and sleeping. She underwent right S1 transforaminal epidural steroid injection in our office on 03/25/2024. She reports complete resolution of right sided radicular symptoms.    PERTINENT HISTORY:  Anemia, hx ankle surgery, Rt CTR  PAIN:  Are you having pain? Yes: NPRS scale: 8 currently, up to 10, at best 8/10 Pain location: low back, Lt side into hips Pain description: aching, sharp Aggravating factors: sitting, walking, standing Relieving factors: heat, lying down  PRECAUTIONS:  None  RED FLAGS: None   WEIGHT BEARING RESTRICTIONS:  No  FALLS:  Has patient fallen in last 6 months? Yes. Number of falls 1; miss stepped at a dip in the road   LIVING ENVIRONMENT: Lives with: lives with their family (husband, daughter, 3 grandchildren (37, 67, 41 y/o - does provide some assistance for [employer]) Lives in: House/apartment Stairs: Yes: External: 2 steps; on left going up   OCCUPATION:  Retired - building control surveyor  PLOF:  Independent and Leisure: gardening, working in yard, walking, playing with grandchildren  PATIENT GOALS:  Improve pain, be able to walk and play with  grandchildren   OBJECTIVE:  Note: Objective measures were completed at Evaluation unless otherwise noted.  DIAGNOSTIC FINDINGS:  Lumbar MRI imaging from February of this year shows degenerative changes to lower lumbar spine, there is left sided disc bulge at L5-S1 narrowing the lateral recess and could contact exiting left L5 nerve root  PATIENT SURVEYS:  Patient-Specific Activity Scoring Scheme  0 represents "unable to perform." 10 represents "able to perform at prior level. 0 1 2 3 4 5 6 7 8 9  10 (Date and Score)   Activity Eval     1. Gardening 1    2. Walking 1    3. Sitting 1   4. Doing activities with grandchildren 1   Score 1    Total score = sum of the activity scores/number of activities Minimum detectable change (90%CI) for average score = 2 points Minimum detectable change (90%CI) for single activity score = 3 points    COGNITIVE STATUS: Within functional limits for tasks assessed   SENSATION: WFL  POSTURE:  rounded shoulders, forward head, and increased lumbar lordosis   GAIT: 09/05/24 Comments: mild antalgic gait; bil trendelenburg; decreased speed   PALPATION: 09/05/24 tenderness with trigger points in Lt QL and glute med  LUMBAR ROM:   ROM A/PROM  eval  Flexion WNL with pain - difficulty rising  Extension WNL with pain  Right quadrant WNL  Left quadrant WNL with pain on Lt   (Blank rows = not tested)    LOWER EXTREMITY MMT:    MMT Right eval Left eval  Hip flexion 4/5 3/5  Hip extension    Hip abduction 3/5 3/5  Hip adduction    Hip internal rotation    Hip external rotation    Knee flexion 4/5 4/5  Knee extension 4/5 3+/5  Ankle dorsiflexion 4/5 3+/5   (Blank rows = not tested)    SPECIAL TESTS:  09/05/24 Lumbar Straight leg raise test: Negative  FUNCTIONAL TESTS:  09/05/24 5 times sit to stand: 27.32 sec without UE support   TREATMENT:  DATE:  09/05/24 TherEx See HEP - demonstrated with trial reps performed PRN, mod cues for comprehension    Self Care Educated on PT POC and clinical findings     PATIENT EDUCATION:  Education details: HEP, DN Person educated: Patient Education method: Explanation, Demonstration, and Handouts Education comprehension: verbalized understanding, returned demonstration, and needs further education  HOME EXERCISE PROGRAM: Access Code: [email or web address] URL: [email or web address] Date: 09/05/2024 Prepared by: Corean Ku  Exercises - Hooklying Single Knee to Chest Stretch with Towel  - 2 x daily - 7 x weekly - 1 sets - 3 reps - 30 sec hold - Supine Piriformis Stretch with Towel  - 2 x daily - 7 x weekly - 1 sets - 3 reps - 30 sec hold - Supine Lower Trunk Rotation  - 2 x daily - 7 x weekly - 1 sets - 3 reps - 30 sec hold - Standing Quadratus Lumborum Mobilization with Small Ball on Wall  - 2 x daily - 7 x weekly - 1 sets - 1 reps - 2-3 min hold   ASSESSMENT:  CLINICAL IMPRESSION: Patient is a 68 y.o. female who was seen today for physical therapy evaluation and treatment for LBP. She demonstrates decreased strength, trigger points, postural abnormalities and continued pain affecting functional mobility.  She will benefit from PT to address deficits listed.     OBJECTIVE IMPAIRMENTS: Abnormal gait, decreased mobility, difficulty walking, decreased strength, hypomobility, increased fascial restrictions, increased muscle spasms, impaired flexibility, postural dysfunction, and pain.   ACTIVITY LIMITATIONS: carrying, lifting, bending, sitting, standing, squatting, stairs, transfers, bed mobility, hygiene/grooming, and locomotion level  PARTICIPATION LIMITATIONS: meal prep, cleaning, laundry, driving, shopping, community activity, and yard work  PERSONAL FACTORS: Age, Behavior pattern, Past/current experiences, Time since onset of  injury/illness/exacerbation, and 3+ comorbidities: Anemia, hx ankle surgery, Rt CTR are also affecting patient's functional outcome.   REHAB POTENTIAL: Good  CLINICAL DECISION MAKING: Evolving/moderate complexity  EVALUATION COMPLEXITY: Moderate   GOALS: Goals reviewed with patient? Yes  SHORT TERM GOALS: Target date: 09/26/2024  Independent with initial HEP Goal status: INITIAL   LONG TERM GOALS: Target date: 10/17/2024  Independent with final HEP Goal status: INITIAL  2.  PSFS score improved by 2 points Goal status: INITIAL  3.  Bil LE strength improved to 4/5 for improved function and mobility Goal status: INIITAL  4.  Report pain < 5/10 with standing and walking for improved function Goal status: INITIAL  5.  5x STS improved to </= 20 sec for improved function and strength Goal status: INITIAL   PLAN:  PT FREQUENCY: 1-2x/week  PT DURATION: 6 weeks  PLANNED INTERVENTIONS: 97164- PT Re-evaluation, 97750- Physical Performance Testing, 97110-Therapeutic exercises, 97530- Therapeutic activity, 97112- Neuromuscular re-education, 97535- Self Care, 02859- Manual therapy, [id number]- Aquatic Therapy, [id number]- Electrical stimulation (unattended), 97035- Ultrasound, 79439 (1-2 muscles), 20561 (3+ muscles)- Dry Needling, Patient/Family education, Balance training, Stair training, Taping, Joint mobilization, Joint manipulation, Spinal manipulation, Spinal mobilization, Cryotherapy, and Moist heat.  PLAN FOR NEXT SESSION: Review HEP, manual/modalities/DN PRN, light strengthening (hip/core) as able   NEXT MD VISIT: 10/21/24   Corean JULIANNA Ku, PT, DPT 09/05/24 11:08 AM

## 2024-09-12 ENCOUNTER — Encounter: Admitting: Rehabilitative and Restorative Service Providers"

## 2024-09-18 ENCOUNTER — Ambulatory Visit (INDEPENDENT_AMBULATORY_CARE_PROVIDER_SITE_OTHER)

## 2024-09-18 ENCOUNTER — Ambulatory Visit

## 2024-09-18 DIAGNOSIS — M6281 Muscle weakness (generalized): Secondary | ICD-10-CM | POA: Diagnosis not present

## 2024-09-18 DIAGNOSIS — R29898 Other symptoms and signs involving the musculoskeletal system: Secondary | ICD-10-CM | POA: Diagnosis not present

## 2024-09-18 DIAGNOSIS — M5459 Other low back pain: Secondary | ICD-10-CM

## 2024-09-18 NOTE — Therapy (Signed)
 OUTPATIENT PHYSICAL THERAPY TREATMENT  Patient Name: Tina Valdez MRN: [id number] DOB:[birth year], 68 y.o., female Today's Date: 09/18/2024  END OF SESSION:  PT End of Session - 09/18/24 1537     Visit Number 2    Number of Visits 12    Date for Recertification  10/17/24    Authorization Type Medicare    PT Start Time 1450    PT Stop Time 1528    PT Time Calculation (min) 38 min    Activity Tolerance Patient tolerated treatment well    Behavior During Therapy WFL for tasks assessed/performed           Past Medical History:  Diagnosis Date   Anemia    vitamin D deficiency years ago)   Medical history non-contributory    Pneumonia    Past Surgical History:  Procedure Laterality Date   ANKLE SURGERY     BILATERAL SALPINGECTOMY Bilateral 10/21/2015   Procedure: BILATERAL SALPINGECTOMY;  Surgeon: Delon Prude, DO;  Location: [facility];  Service: Gynecology;  Laterality: Bilateral;   CARPAL TUNNEL RELEASE     Left wrist   CARPAL TUNNEL RELEASE Right 04/29/2020   Procedure: RIGHT CARPAL TUNNEL RELEASE;  Surgeon: Jerri Kay HERO, MD;  Location: [facility];  Service: Orthopedics;  Laterality: Right;   CESAREAN SECTION     CHOLECYSTECTOMY     COLONOSCOPY     DILATION AND CURETTAGE OF UTERUS     LAPAROSCOPIC VAGINAL HYSTERECTOMY WITH SALPINGECTOMY Bilateral 10/21/2015   Procedure: LAPAROSCOPIC ASSISTED VAGINAL HYSTERECTOMY ;  Surgeon: Delon Prude, DO;  Location: [facility];  Service: Gynecology;  Laterality: Bilateral;   PILONIDAL CYST EXCISION     Patient Active Problem List   Diagnosis Date Noted   Carpal tunnel syndrome on right 04/28/2020   Postmenopausal bleeding 10/21/2015    PCP: Jordan, Betty G, MD  REFERRING PROVIDER: Trudy Duwaine BRAVO, NP  REFERRING DIAG: [phone] (ICD-10-CM) - Chronic bilateral low back pain without sciatica M47.817 (ICD-10-CM) - Spondylosis without myelopathy or radiculopathy, lumbosacral region M79.18 (ICD-10-CM) - Myofascial pain  syndrome  Rationale for Evaluation and Treatment: Rehabilitation  THERAPY DIAG:  Other low back pain  Muscle weakness (generalized)  Other symptoms and signs involving the musculoskeletal system  ONSET DATE: Aug 2025   SUBJECTIVE:                                                                                                                                                                                           SUBJECTIVE STATEMENT: Pt reports trouble using the ball on the wall for a stretch, but othere exercises going well.  PERTINENT HISTORY:  Anemia, hx ankle  surgery, Rt CTR  PAIN:  Are you having pain? Yes: NPRS scale:  8/10 Pain location: low back, Lt side into hips Pain description: aching, sharp Aggravating factors: sitting, walking, standing Relieving factors: heat, lying down  PRECAUTIONS:  None  RED FLAGS: None   WEIGHT BEARING RESTRICTIONS:  No  FALLS:  Has patient fallen in last 6 months? Yes. Number of falls 1; miss stepped at a dip in the road   LIVING ENVIRONMENT: Lives with: lives with their family (husband, daughter, 3 grandchildren (70, 57, 40 y/o - does provide some assistance for [employer]) Lives in: House/apartment Stairs: Yes: External: 2 steps; on left going up   OCCUPATION:  Retired - building control surveyor  PLOF:  Independent and Leisure: gardening, working in yard, walking, playing with grandchildren  PATIENT GOALS:  Improve pain, be able to walk and play with grandchildren   OBJECTIVE:  Note: Objective measures were completed at Evaluation unless otherwise noted.  DIAGNOSTIC FINDINGS:  Lumbar MRI imaging from February of this year shows degenerative changes to lower lumbar spine, there is left sided disc bulge at L5-S1 narrowing the lateral recess and could contact exiting left L5 nerve root  PATIENT SURVEYS:  Patient-Specific Activity Scoring Scheme  0 represents "unable to perform." 10 represents "able to perform at prior  level. 0 1 2 3 4 5 6 7 8 9  10 (Date and Score)   Activity Eval     1. Gardening 1    2. Walking 1    3. Sitting 1   4. Doing activities with grandchildren 1   Score 1    Total score = sum of the activity scores/number of activities Minimum detectable change (90%CI) for average score = 2 points Minimum detectable change (90%CI) for single activity score = 3 points    COGNITIVE STATUS: Within functional limits for tasks assessed   SENSATION: WFL  POSTURE:  rounded shoulders, forward head, and increased lumbar lordosis   GAIT: 09/05/24 Comments: mild antalgic gait; bil trendelenburg; decreased speed   PALPATION: 09/05/24 tenderness with trigger points in Lt QL and glute med  LUMBAR ROM:   ROM A/PROM  eval  Flexion WNL with pain - difficulty rising  Extension WNL with pain  Right quadrant WNL  Left quadrant WNL with pain on Lt   (Blank rows = not tested)    LOWER EXTREMITY MMT:    MMT Right eval Left eval  Hip flexion 4/5 3/5  Hip extension    Hip abduction 3/5 3/5  Hip adduction    Hip internal rotation    Hip external rotation    Knee flexion 4/5 4/5  Knee extension 4/5 3+/5  Ankle dorsiflexion 4/5 3+/5   (Blank rows = not tested)    SPECIAL TESTS:  09/05/24 Lumbar Straight leg raise test: Negative  FUNCTIONAL TESTS:  09/05/24 5 times sit to stand: 27.32 sec without UE support   TREATMENT:  DATE:  09/18/24 Nustep level 4 5 min Full HEP review PPT 2x10 2 sec Lumbar rotations as motion 20x Manual L rotators and QL stretch in spine, percussive unit to L QL    09/05/24 TherEx See HEP - demonstrated with trial reps performed PRN, mod cues for comprehension    Self Care Educated on PT POC and clinical findings     PATIENT EDUCATION:  Education details: HEP, DN Person educated: Patient Education method:  Programmer, Multimedia, Demonstration, and Handouts Education comprehension: verbalized understanding, returned demonstration, and needs further education  HOME EXERCISE PROGRAM: Access Code: [email or web address] URL: [email or web address] Date: 09/18/2024 Prepared by: Burnard Meth  Exercises - Hooklying Single Knee to Chest Stretch with Towel  - 2 x daily - 7 x weekly - 1 sets - 3 reps - 30 sec hold - Supine Piriformis Stretch with Towel  - 2 x daily - 7 x weekly - 1 sets - 3 reps - 30 sec hold - Supine Lower Trunk Rotation  - 2 x daily - 7 x weekly - 1 sets - 3 reps - 30 sec hold - Standing Quadratus Lumborum Mobilization with Small Ball on Wall  - 2 x daily - 7 x weekly - 1 sets - 1 reps - 2-3 min hold - Supine Lower Trunk Rotation  - 2 x daily - 7 x weekly - 10 reps - 2 hold - Supine Posterior Pelvic Tilt  - 2 x daily - 7 x weekly - 2 sets - 10 reps - 2 hold ASSESSMENT:  CLINICAL IMPRESSION: Patient needed VC for proper form with new exercises.  Pt to try larger ball for QL stretch- Baci Ball size to improve form and get better results.   OBJECTIVE IMPAIRMENTS: Abnormal gait, decreased mobility, difficulty walking, decreased strength, hypomobility, increased fascial restrictions, increased muscle spasms, impaired flexibility, postural dysfunction, and pain.   ACTIVITY LIMITATIONS: carrying, lifting, bending, sitting, standing, squatting, stairs, transfers, bed mobility, hygiene/grooming, and locomotion level  PARTICIPATION LIMITATIONS: meal prep, cleaning, laundry, driving, shopping, community activity, and yard work  PERSONAL FACTORS: Age, Behavior pattern, Past/current experiences, Time since onset of injury/illness/exacerbation, and 3+ comorbidities: Anemia, hx ankle surgery, Rt CTR are also affecting patient's functional outcome.   REHAB POTENTIAL: Good  CLINICAL DECISION MAKING: Evolving/moderate complexity  EVALUATION COMPLEXITY: Moderate   GOALS: Goals reviewed with patient?  Yes  SHORT TERM GOALS: Target date: 09/26/2024  Independent with initial HEP Goal status: INITIAL   LONG TERM GOALS: Target date: 10/17/2024  Independent with final HEP Goal status: INITIAL  2.  PSFS score improved by 2 points Goal status: INITIAL  3.  Bil LE strength improved to 4/5 for improved function and mobility Goal status: INIITAL  4.  Report pain < 5/10 with standing and walking for improved function Goal status: INITIAL  5.  5x STS improved to </= 20 sec for improved function and strength Goal status: INITIAL   PLAN:  PT FREQUENCY: 1-2x/week  PT DURATION: 6 weeks  PLANNED INTERVENTIONS: 97164- PT Re-evaluation, 97750- Physical Performance Testing, 97110-Therapeutic exercises, 97530- Therapeutic activity, 97112- Neuromuscular re-education, 97535- Self Care, 02859- Manual therapy, [id number]- Aquatic Therapy, H9716- Electrical stimulation (unattended), 97035- Ultrasound, 79439 (1-2 muscles), 20561 (3+ muscles)- Dry Needling, Patient/Family education, Balance training, Stair training, Taping, Joint mobilization, Joint manipulation, Spinal manipulation, Spinal mobilization, Cryotherapy, and Moist heat.  PLAN FOR NEXT SESSION: light strengthening (hip/core) as able   NEXT MD VISIT: 10/21/24  Burnard Meth, PT 09/18/24  3:42 PM

## 2024-09-25 ENCOUNTER — Ambulatory Visit: Admitting: Physical Therapy

## 2024-09-25 ENCOUNTER — Encounter: Payer: Self-pay | Admitting: Physical Therapy

## 2024-09-25 DIAGNOSIS — M5459 Other low back pain: Secondary | ICD-10-CM | POA: Diagnosis not present

## 2024-09-25 DIAGNOSIS — R29898 Other symptoms and signs involving the musculoskeletal system: Secondary | ICD-10-CM

## 2024-09-25 DIAGNOSIS — M6281 Muscle weakness (generalized): Secondary | ICD-10-CM

## 2024-09-25 NOTE — Therapy (Signed)
 OUTPATIENT PHYSICAL THERAPY TREATMENT  Patient Name: Tina Valdez MRN: [id number] DOB:December 07, 1955, 68 y.o., female Today's Date: 09/25/2024  END OF SESSION:  PT End of Session - 09/25/24 1017     Visit Number 3    Number of Visits 12    Date for Recertification  10/17/24    Authorization Type Medicare    PT Start Time 1017    PT Stop Time 1057    PT Time Calculation (min) 40 min    Activity Tolerance Patient tolerated treatment well    Behavior During Therapy WFL for tasks assessed/performed            Past Medical History:  Diagnosis Date   Anemia    vitamin D deficiency years ago)   Medical history non-contributory    Pneumonia    Past Surgical History:  Procedure Laterality Date   ANKLE SURGERY     BILATERAL SALPINGECTOMY Bilateral 10/21/2015   Procedure: BILATERAL SALPINGECTOMY;  Surgeon: Delon Prude, DO;  Location: [facility];  Service: Gynecology;  Laterality: Bilateral;   CARPAL TUNNEL RELEASE     Left wrist   CARPAL TUNNEL RELEASE Right 04/29/2020   Procedure: RIGHT CARPAL TUNNEL RELEASE;  Surgeon: Jerri Kay HERO, MD;  Location: [facility];  Service: Orthopedics;  Laterality: Right;   CESAREAN SECTION     CHOLECYSTECTOMY     COLONOSCOPY     DILATION AND CURETTAGE OF UTERUS     LAPAROSCOPIC VAGINAL HYSTERECTOMY WITH SALPINGECTOMY Bilateral 10/21/2015   Procedure: LAPAROSCOPIC ASSISTED VAGINAL HYSTERECTOMY ;  Surgeon: Delon Prude, DO;  Location: [facility];  Service: Gynecology;  Laterality: Bilateral;   PILONIDAL CYST EXCISION     Patient Active Problem List   Diagnosis Date Noted   Carpal tunnel syndrome on right 04/28/2020   Postmenopausal bleeding 10/21/2015    PCP: Jordan, Betty G, MD  REFERRING PROVIDER: Trudy Duwaine BRAVO, NP  REFERRING DIAG: [phone] (ICD-10-CM) - Chronic bilateral low back pain without sciatica M47.817 (ICD-10-CM) - Spondylosis without myelopathy or radiculopathy, lumbosacral region M79.18 (ICD-10-CM) - Myofascial pain  syndrome  Rationale for Evaluation and Treatment: Rehabilitation  THERAPY DIAG:  Other low back pain  Muscle weakness (generalized)  Other symptoms and signs involving the musculoskeletal system  ONSET DATE: Aug 2025   SUBJECTIVE:                                                                                                                                                                                           SUBJECTIVE STATEMENT: Back is doing better; at times can go several hours without pain.  PERTINENT HISTORY:  Anemia, hx ankle surgery, Rt CTR  PAIN:  Are you having pain? Yes: NPRS scale:  teeny bit did not rate today10 Pain location: low back, Lt side into hips Pain description: aching, sharp Aggravating factors: sitting, walking, standing Relieving factors: heat, lying down  PRECAUTIONS:  None  RED FLAGS: None   WEIGHT BEARING RESTRICTIONS:  No  FALLS:  Has patient fallen in last 6 months? Yes. Number of falls 1; miss stepped at a dip in the road   LIVING ENVIRONMENT: Lives with: lives with their family (husband, daughter, 3 grandchildren (57, 22, 81 y/o - does provide some assistance for [employer]) Lives in: House/apartment Stairs: Yes: External: 2 steps; on left going up   OCCUPATION:  Retired - building control surveyor  PLOF:  Independent and Leisure: gardening, working in yard, walking, playing with grandchildren  PATIENT GOALS:  Improve pain, be able to walk and play with grandchildren   OBJECTIVE:  Note: Objective measures were completed at Evaluation unless otherwise noted.  DIAGNOSTIC FINDINGS:  Lumbar MRI imaging from February of this year shows degenerative changes to lower lumbar spine, there is left sided disc bulge at L5-S1 narrowing the lateral recess and could contact exiting left L5 nerve root  PATIENT SURVEYS:  Patient-Specific Activity Scoring Scheme  0 represents "unable to perform." 10 represents "able to perform at prior  level. 0 1 2 3 4 5 6 7 8 9  10 (Date and Score)   Activity Eval     1. Gardening 1    2. Walking 1    3. Sitting 1   4. Doing activities with grandchildren 1   Score 1    Total score = sum of the activity scores/number of activities Minimum detectable change (90%CI) for average score = 2 points Minimum detectable change (90%CI) for single activity score = 3 points    COGNITIVE STATUS: Within functional limits for tasks assessed   SENSATION: WFL  POSTURE:  rounded shoulders, forward head, and increased lumbar lordosis   GAIT: 09/05/24 Comments: mild antalgic gait; bil trendelenburg; decreased speed   PALPATION: 09/05/24 tenderness with trigger points in Lt QL and glute med  LUMBAR ROM:   ROM A/PROM  eval  Flexion WNL with pain - difficulty rising  Extension WNL with pain  Right quadrant WNL  Left quadrant WNL with pain on Lt   (Blank rows = not tested)    LOWER EXTREMITY MMT:    MMT Right eval Left eval  Hip flexion 4/5 3/5  Hip extension    Hip abduction 3/5 3/5  Hip adduction    Hip internal rotation    Hip external rotation    Knee flexion 4/5 4/5  Knee extension 4/5 3+/5  Ankle dorsiflexion 4/5 3+/5   (Blank rows = not tested)    SPECIAL TESTS:  09/05/24 Lumbar Straight leg raise test: Negative  FUNCTIONAL TESTS:  09/05/24 5 times sit to stand: 27.32 sec without UE support   TREATMENT:  DATE:  09/25/24 TherAct NuStep L5 x 8 min; LEs only Standing hip abduction 2x10 bil; L3 band Rows L4 band 2x10; 5 sec hold  TherEx Seated forward stretch to Lt/Rt x 10 reps; 10 sec hold bil Sidelying book openers 10 x 10 sec hold bil  Manual Percussive device to Lt glute med trigger point   09/18/24 Nustep level 4 5 min Full HEP review PPT 2x10 2 sec Lumbar rotations as motion 20x Manual L rotators and QL stretch in  spine, percussive unit to L QL    09/05/24 TherEx See HEP - demonstrated with trial reps performed PRN, mod cues for comprehension    Self Care Educated on PT POC and clinical findings     PATIENT EDUCATION:  Education details: HEP, DN Person educated: Patient Education method: Programmer, Multimedia, Demonstration, and Handouts Education comprehension: verbalized understanding, returned demonstration, and needs further education  HOME EXERCISE PROGRAM: Access Code: [email or web address] URL: [email or web address] Date: 09/18/2024 Prepared by: Burnard Meth  Exercises - Hooklying Single Knee to Chest Stretch with Towel  - 2 x daily - 7 x weekly - 1 sets - 3 reps - 30 sec hold - Supine Piriformis Stretch with Towel  - 2 x daily - 7 x weekly - 1 sets - 3 reps - 30 sec hold - Supine Lower Trunk Rotation  - 2 x daily - 7 x weekly - 1 sets - 3 reps - 30 sec hold - Standing Quadratus Lumborum Mobilization with Small Ball on Wall  - 2 x daily - 7 x weekly - 1 sets - 1 reps - 2-3 min hold - Supine Lower Trunk Rotation  - 2 x daily - 7 x weekly - 10 reps - 2 hold - Supine Posterior Pelvic Tilt  - 2 x daily - 7 x weekly - 2 sets - 10 reps - 2 hold   ASSESSMENT:  CLINICAL IMPRESSION: Pt overall reporting improvement in symptoms, even going several hours without pain.  Still occasionally feels some pain with rotation so worked on some graded tolerance to this activity today.  Overall progressing well with PT to maximize function.   OBJECTIVE IMPAIRMENTS: Abnormal gait, decreased mobility, difficulty walking, decreased strength, hypomobility, increased fascial restrictions, increased muscle spasms, impaired flexibility, postural dysfunction, and pain.   ACTIVITY LIMITATIONS: carrying, lifting, bending, sitting, standing, squatting, stairs, transfers, bed mobility, hygiene/grooming, and locomotion level  PARTICIPATION LIMITATIONS: meal prep, cleaning, laundry, driving, shopping, community activity,  and yard work  PERSONAL FACTORS: Age, Behavior pattern, Past/current experiences, Time since onset of injury/illness/exacerbation, and 3+ comorbidities: Anemia, hx ankle surgery, Rt CTR are also affecting patient's functional outcome.   REHAB POTENTIAL: Good  CLINICAL DECISION MAKING: Evolving/moderate complexity  EVALUATION COMPLEXITY: Moderate   GOALS: Goals reviewed with patient? Yes  SHORT TERM GOALS: Target date: 09/26/2024  Independent with initial HEP Goal status: MET 09/25/24   LONG TERM GOALS: Target date: 10/17/2024  Independent with final HEP Goal status: INITIAL  2.  PSFS score improved by 2 points Goal status: INITIAL  3.  Bil LE strength improved to 4/5 for improved function and mobility Goal status: INIITAL  4.  Report pain < 5/10 with standing and walking for improved function Goal status: INITIAL  5.  5x STS improved to </= 20 sec for improved function and strength Goal status: INITIAL   PLAN:  PT FREQUENCY: 1-2x/week  PT DURATION: 6 weeks  PLANNED INTERVENTIONS: 97164- PT Re-evaluation, 97750- Physical Performance Testing, 97110-Therapeutic exercises, 97530- Therapeutic activity, [id number]- Neuromuscular re-education, 97535- Self  Care, 02859- Manual therapy, [phone]- Aquatic Therapy, [phone]- Electrical stimulation (unattended), [phone]- Ultrasound, [phone] (1-2 muscles), [phone] (3+ muscles)- Dry Needling, Patient/Family education, Balance training, Stair training, Taping, Joint mobilization, Joint manipulation, Spinal manipulation, Spinal mobilization, Cryotherapy, and Moist heat.  PLAN FOR NEXT SESSION: continue light strengthening (hip/core) as able   NEXT MD VISIT: 10/21/24  Corean JULIANNA Ku, PT, DPT 09/25/24 10:59 AM

## 2024-09-27 ENCOUNTER — Encounter

## 2024-10-01 ENCOUNTER — Encounter: Admitting: Physical Therapy

## 2024-10-03 ENCOUNTER — Ambulatory Visit: Admitting: Physical Therapy

## 2024-10-03 ENCOUNTER — Encounter: Payer: Self-pay | Admitting: Physical Therapy

## 2024-10-03 DIAGNOSIS — M5459 Other low back pain: Secondary | ICD-10-CM

## 2024-10-03 DIAGNOSIS — R29898 Other symptoms and signs involving the musculoskeletal system: Secondary | ICD-10-CM

## 2024-10-03 DIAGNOSIS — M6281 Muscle weakness (generalized): Secondary | ICD-10-CM

## 2024-10-03 NOTE — Therapy (Signed)
 OUTPATIENT PHYSICAL THERAPY TREATMENT  Patient Name: Vieva Brummitt MRN: [id number] DOB:Mar 09, 1956, 68 y.o., female Today's Date: 10/03/2024  END OF SESSION:  PT End of Session - 10/03/24 0803     Visit Number 4    Number of Visits 12    Date for Recertification  10/17/24    Authorization Type Medicare    PT Start Time 0803    PT Stop Time 0841    PT Time Calculation (min) 38 min    Activity Tolerance Patient tolerated treatment well    Behavior During Therapy [facility] for tasks assessed/performed             Past Medical History:  Diagnosis Date   Anemia    vitamin D deficiency years ago)   Medical history non-contributory    Pneumonia    Past Surgical History:  Procedure Laterality Date   ANKLE SURGERY     BILATERAL SALPINGECTOMY Bilateral 10/21/2015   Procedure: BILATERAL SALPINGECTOMY;  Surgeon: Delon Prude, DO;  Location: [facility];  Service: Gynecology;  Laterality: Bilateral;   CARPAL TUNNEL RELEASE     Left wrist   CARPAL TUNNEL RELEASE Right 04/29/2020   Procedure: RIGHT CARPAL TUNNEL RELEASE;  Surgeon: Jerri Kay HERO, MD;  Location: [facility];  Service: Orthopedics;  Laterality: Right;   CESAREAN SECTION     CHOLECYSTECTOMY     COLONOSCOPY     DILATION AND CURETTAGE OF UTERUS     LAPAROSCOPIC VAGINAL HYSTERECTOMY WITH SALPINGECTOMY Bilateral 10/21/2015   Procedure: LAPAROSCOPIC ASSISTED VAGINAL HYSTERECTOMY ;  Surgeon: Delon Prude, DO;  Location: [facility];  Service: Gynecology;  Laterality: Bilateral;   PILONIDAL CYST EXCISION     Patient Active Problem List   Diagnosis Date Noted   Carpal tunnel syndrome on right 04/28/2020   Postmenopausal bleeding 10/21/2015    PCP: Jordan, Betty G, MD  REFERRING PROVIDER: Trudy Duwaine BRAVO, NP  REFERRING DIAG: [phone] (ICD-10-CM) - Chronic bilateral low back pain without sciatica M47.817 (ICD-10-CM) - Spondylosis without myelopathy or radiculopathy, lumbosacral region M79.18 (ICD-10-CM) - Myofascial pain  syndrome  Rationale for Evaluation and Treatment: Rehabilitation  THERAPY DIAG:  Other low back pain  Muscle weakness (generalized)  Other symptoms and signs involving the musculoskeletal system  ONSET DATE: Aug 2025   SUBJECTIVE:                                                                                                                                                                                           SUBJECTIVE STATEMENT: Back is getting better bit by bit.  PERTINENT HISTORY:  Anemia, hx ankle surgery, Rt CTR  PAIN:  Are you  having pain? Yes: NPRS scale:  4/10 Pain location: low back, Lt side into hips Pain description: aching, sharp Aggravating factors: sitting, walking, standing Relieving factors: heat, lying down  PRECAUTIONS:  None  RED FLAGS: None   WEIGHT BEARING RESTRICTIONS:  No  FALLS:  Has patient fallen in last 6 months? Yes. Number of falls 1; miss stepped at a dip in the road   LIVING ENVIRONMENT: Lives with: lives with their family (husband, daughter, 3 grandchildren (38, 37, 8 y/o - does provide some assistance for [employer]) Lives in: House/apartment Stairs: Yes: External: 2 steps; on left going up   OCCUPATION:  Retired - building control surveyor  PLOF:  Independent and Leisure: gardening, working in yard, walking, playing with grandchildren  PATIENT GOALS:  Improve pain, be able to walk and play with grandchildren   OBJECTIVE:  Note: Objective measures were completed at Evaluation unless otherwise noted.  DIAGNOSTIC FINDINGS:  Lumbar MRI imaging from February of this year shows degenerative changes to lower lumbar spine, there is left sided disc bulge at L5-S1 narrowing the lateral recess and could contact exiting left L5 nerve root  PATIENT SURVEYS:  Patient-Specific Activity Scoring Scheme  0 represents unable to perform. 10 represents able to perform at prior level. 0 1 2 3 4 5 6 7 8 9  10 (Date and  Score)   Activity Eval     1. Gardening 1    2. Walking 1    3. Sitting 1   4. Doing activities with grandchildren 1   Score 1    Total score = sum of the activity scores/number of activities Minimum detectable change (90%CI) for average score = 2 points Minimum detectable change (90%CI) for single activity score = 3 points    COGNITIVE STATUS: Within functional limits for tasks assessed   SENSATION: WFL  POSTURE:  rounded shoulders, forward head, and increased lumbar lordosis   GAIT: 09/05/24 Comments: mild antalgic gait; bil trendelenburg; decreased speed   PALPATION: 09/05/24 tenderness with trigger points in Lt QL and glute med  LUMBAR ROM:   ROM A/PROM  eval  Flexion WNL with pain - difficulty rising  Extension WNL with pain  Right quadrant WNL  Left quadrant WNL with pain on Lt   (Blank rows = not tested)    LOWER EXTREMITY MMT:    MMT Right eval Left eval  Hip flexion 4/5 3/5  Hip extension    Hip abduction 3/5 3/5  Hip adduction    Hip internal rotation    Hip external rotation    Knee flexion 4/5 4/5  Knee extension 4/5 3+/5  Ankle dorsiflexion 4/5 3+/5   (Blank rows = not tested)    SPECIAL TESTS:  09/05/24 Lumbar Straight leg raise test: Negative  FUNCTIONAL TESTS:  09/05/24 5 times sit to stand: 27.32 sec without UE support   TREATMENT:  DATE:  10/03/24 TherAct NuStep L4 x 8 min; LEs only Standing hip abduction 2x10 bil; L3 band Standing hip extension 2x10 bil; L3 band Squats 2x10 with UE support Sit to/from stand 2x10 with 10# KB Rows L4 band 2x10; 5 sec hold Shoulder ext L4 2x10; 3 sec hold  TherEx Standing book openers 10 x 10 sec bil; within pain minimized range Seated forward flexion stretch with stool 10 x 10 sec hold   09/25/24 TherAct NuStep L5 x 8 min; LEs only Standing hip abduction  2x10 bil; L3 band Rows L4 band 2x10; 5 sec hold  TherEx Seated forward stretch to Lt/Rt x 10 reps; 10 sec hold bil Sidelying book openers 10 x 10 sec hold bil  Manual Percussive device to Lt glute med trigger point   09/18/24 Nustep level 4 5 min Full HEP review PPT 2x10 2 sec Lumbar rotations as motion 20x Manual L rotators and QL stretch in spine, percussive unit to L QL    09/05/24 TherEx See HEP - demonstrated with trial reps performed PRN, mod cues for comprehension    Self Care Educated on PT POC and clinical findings     PATIENT EDUCATION:  Education details: HEP, DN Person educated: Patient Education method: Programmer, Multimedia, Demonstration, and Handouts Education comprehension: verbalized understanding, returned demonstration, and needs further education  HOME EXERCISE PROGRAM: Access Code: [email or web address] URL: [email or web address] Date: 09/18/2024 Prepared by: Burnard Meth  Exercises - Hooklying Single Knee to Chest Stretch with Towel  - 2 x daily - 7 x weekly - 1 sets - 3 reps - 30 sec hold - Supine Piriformis Stretch with Towel  - 2 x daily - 7 x weekly - 1 sets - 3 reps - 30 sec hold - Supine Lower Trunk Rotation  - 2 x daily - 7 x weekly - 1 sets - 3 reps - 30 sec hold - Standing Quadratus Lumborum Mobilization with Small Ball on Wall  - 2 x daily - 7 x weekly - 1 sets - 1 reps - 2-3 min hold - Supine Lower Trunk Rotation  - 2 x daily - 7 x weekly - 10 reps - 2 hold - Supine Posterior Pelvic Tilt  - 2 x daily - 7 x weekly - 2 sets - 10 reps - 2 hold   ASSESSMENT:  CLINICAL IMPRESSION: Pt tolerated session well today, continues to have decreased reports of pain and improved mobility.  Continue skilled PT at this time.    OBJECTIVE IMPAIRMENTS: Abnormal gait, decreased mobility, difficulty walking, decreased strength, hypomobility, increased fascial restrictions, increased muscle spasms, impaired flexibility, postural dysfunction, and pain.    ACTIVITY LIMITATIONS: carrying, lifting, bending, sitting, standing, squatting, stairs, transfers, bed mobility, hygiene/grooming, and locomotion level  PARTICIPATION LIMITATIONS: meal prep, cleaning, laundry, driving, shopping, community activity, and yard work  PERSONAL FACTORS: Age, Behavior pattern, Past/current experiences, Time since onset of injury/illness/exacerbation, and 3+ comorbidities: Anemia, hx ankle surgery, Rt CTR are also affecting patient's functional outcome.   REHAB POTENTIAL: Good  CLINICAL DECISION MAKING: Evolving/moderate complexity  EVALUATION COMPLEXITY: Moderate   GOALS: Goals reviewed with patient? Yes  SHORT TERM GOALS: Target date: 09/26/2024  Independent with initial HEP Goal status: MET 09/25/24   LONG TERM GOALS: Target date: 10/17/2024  Independent with final HEP Goal status: INITIAL  2.  PSFS score improved by 2 points Goal status: INITIAL  3.  Bil LE strength improved to 4/5 for improved function and mobility Goal status: INIITAL  4.  Report pain < 5/10  with standing and walking for improved function Goal status: INITIAL  5.  5x STS improved to </= 20 sec for improved function and strength Goal status: INITIAL   PLAN:  PT FREQUENCY: 1-2x/week  PT DURATION: 6 weeks  PLANNED INTERVENTIONS: 97164- PT Re-evaluation, 97750- Physical Performance Testing, 97110-Therapeutic exercises, 97530- Therapeutic activity, 97112- Neuromuscular re-education, 97535- Self Care, 02859- Manual therapy, [id number]- Aquatic Therapy, [id number]- Electrical stimulation (unattended), 97035- Ultrasound, 79439 (1-2 muscles), 20561 (3+ muscles)- Dry Needling, Patient/Family education, Balance training, Stair training, Taping, Joint mobilization, Joint manipulation, Spinal manipulation, Spinal mobilization, Cryotherapy, and Moist heat.  PLAN FOR NEXT SESSION: standing/seated core work,  continue light strengthening (hip/core) as able   NEXT MD VISIT:  10/21/24  Corean JULIANNA Ku, PT, DPT 10/03/2024 8:45 AM

## 2024-10-06 NOTE — Therapy (Signed)
 OUTPATIENT PHYSICAL THERAPY TREATMENT  Patient Name: Tina Valdez MRN: [id number] DOB:1956/05/09, 68 y.o., female Today's Date: 10/08/2024  END OF SESSION:  PT End of Session - 10/08/24 1038     Visit Number 5    Number of Visits 12    Date for Recertification  10/17/24    Authorization Type Medicare    PT Start Time 1015    PT Stop Time 1053    PT Time Calculation (min) 38 min    Activity Tolerance Patient tolerated treatment well    Behavior During Therapy WFL for tasks assessed/performed              Past Medical History:  Diagnosis Date   Anemia    vitamin D deficiency years ago)   Medical history non-contributory    Pneumonia    Past Surgical History:  Procedure Laterality Date   ANKLE SURGERY     BILATERAL SALPINGECTOMY Bilateral 10/21/2015   Procedure: BILATERAL SALPINGECTOMY;  Surgeon: Delon Prude, DO;  Location: [facility];  Service: Gynecology;  Laterality: Bilateral;   CARPAL TUNNEL RELEASE     Left wrist   CARPAL TUNNEL RELEASE Right 04/29/2020   Procedure: RIGHT CARPAL TUNNEL RELEASE;  Surgeon: Jerri Kay HERO, MD;  Location: [facility];  Service: Orthopedics;  Laterality: Right;   CESAREAN SECTION     CHOLECYSTECTOMY     COLONOSCOPY     DILATION AND CURETTAGE OF UTERUS     LAPAROSCOPIC VAGINAL HYSTERECTOMY WITH SALPINGECTOMY Bilateral 10/21/2015   Procedure: LAPAROSCOPIC ASSISTED VAGINAL HYSTERECTOMY ;  Surgeon: Delon Prude, DO;  Location: [facility];  Service: Gynecology;  Laterality: Bilateral;   PILONIDAL CYST EXCISION     Patient Active Problem List   Diagnosis Date Noted   Carpal tunnel syndrome on right 04/28/2020   Postmenopausal bleeding 10/21/2015    PCP: Jordan, Betty G, MD  REFERRING PROVIDER: Trudy Duwaine BRAVO, NP  REFERRING DIAG: [phone] (ICD-10-CM) - Chronic bilateral low back pain without sciatica M47.817 (ICD-10-CM) - Spondylosis without myelopathy or radiculopathy, lumbosacral region M79.18 (ICD-10-CM) - Myofascial pain  syndrome  Rationale for Evaluation and Treatment: Rehabilitation  THERAPY DIAG:  Other low back pain  Muscle weakness (generalized)  Other symptoms and signs involving the musculoskeletal system  ONSET DATE: Aug 2025   SUBJECTIVE:                                                                                                                                                                                           SUBJECTIVE STATEMENT: Back is feeling no pain today.  PERTINENT HISTORY:  Anemia, hx ankle surgery, Rt CTR  PAIN:  Are you  having pain? Yes: NPRS scale:  0/10 Pain location: low back, Lt side into hips Pain description: aching, sharp Aggravating factors: sitting, walking, standing Relieving factors: heat, lying down  PRECAUTIONS:  None  RED FLAGS: None   WEIGHT BEARING RESTRICTIONS:  No  FALLS:  Has patient fallen in last 6 months? Yes. Number of falls 1; miss stepped at a dip in the road   LIVING ENVIRONMENT: Lives with: lives with their family (husband, daughter, 3 grandchildren (44, 21, 75 y/o - does provide some assistance for [employer]) Lives in: House/apartment Stairs: Yes: External: 2 steps; on left going up   OCCUPATION:  Retired - building control surveyor  PLOF:  Independent and Leisure: gardening, working in yard, walking, playing with grandchildren  PATIENT GOALS:  Improve pain, be able to walk and play with grandchildren   OBJECTIVE:  Note: Objective measures were completed at Evaluation unless otherwise noted.  DIAGNOSTIC FINDINGS:  Lumbar MRI imaging from February of this year shows degenerative changes to lower lumbar spine, there is left sided disc bulge at L5-S1 narrowing the lateral recess and could contact exiting left L5 nerve root  PATIENT SURVEYS:  Patient-Specific Activity Scoring Scheme  0 represents unable to perform. 10 represents able to perform at prior level. 0 1 2 3 4 5 6 7 8 9  10 (Date and Score)   Activity Eval      1. Gardening 1    2. Walking 1    3. Sitting 1   4. Doing activities with grandchildren 1   Score 1    Total score = sum of the activity scores/number of activities Minimum detectable change (90%CI) for average score = 2 points Minimum detectable change (90%CI) for single activity score = 3 points    COGNITIVE STATUS: Within functional limits for tasks assessed   SENSATION: WFL  POSTURE:  rounded shoulders, forward head, and increased lumbar lordosis   GAIT: 09/05/24 Comments: mild antalgic gait; bil trendelenburg; decreased speed   PALPATION: 09/05/24 tenderness with trigger points in Lt QL and glute med  LUMBAR ROM:   ROM A/PROM  eval  Flexion WNL with pain - difficulty rising  Extension WNL with pain  Right quadrant WNL  Left quadrant WNL with pain on Lt   (Blank rows = not tested)    LOWER EXTREMITY MMT:    MMT Right eval Left eval  Hip flexion 4/5 3/5  Hip extension    Hip abduction 3/5 3/5  Hip adduction    Hip internal rotation    Hip external rotation    Knee flexion 4/5 4/5  Knee extension 4/5 3+/5  Ankle dorsiflexion 4/5 3+/5   (Blank rows = not tested)    SPECIAL TESTS:  09/05/24 Lumbar Straight leg raise test: Negative  FUNCTIONAL TESTS:  09/05/24 5 times sit to stand: 27.32 sec without UE support   TREATMENT:  DATE:  10/08/24 LBP TherAct NuStep L4 x 8 min; LEs only Standing hip abduction 5x5 bil; L3 band Standing hip extension 5x5 bil; L3 band Squats 2x10 with UE support Sit to/from stand 2x10 with 10# KB Rows L4 band 2x10; 5 sec hold Shoulder ext L4 2x10; 3 sec hold  TherEx Standing book openers 10 x 10 sec bil; within pain minimized range Seated forward flexion stretch with stool 10 x 10 sec hold     10/03/24 TherAct NuStep L4 x 8 min; LEs only Standing hip abduction 2x10 bil; L3  band Standing hip extension 2x10 bil; L3 band Squats 2x10 with UE support Sit to/from stand 2x10 with 10# KB Rows L4 band 2x10; 5 sec hold Shoulder ext L4 2x10; 3 sec hold  TherEx Standing book openers 10 x 10 sec bil; within pain minimized range Seated forward flexion stretch with stool 10 x 10 sec hold   09/25/24 TherAct NuStep L5 x 8 min; LEs only Standing hip abduction 2x10 bil; L3 band Rows L4 band 2x10; 5 sec hold  TherEx Seated forward stretch to Lt/Rt x 10 reps; 10 sec hold bil Sidelying book openers 10 x 10 sec hold bil  Manual Percussive device to Lt glute med trigger point   PATIENT EDUCATION:  Education details: HEP, DN Person educated: Patient Education method: Explanation, Demonstration, and Handouts Education comprehension: verbalized understanding, returned demonstration, and needs further education  HOME EXERCISE PROGRAM: Access Code: [email or web address] URL: [email or web address] Date: 09/18/2024 Prepared by: Burnard Meth  Exercises - Hooklying Single Knee to Chest Stretch with Towel  - 2 x daily - 7 x weekly - 1 sets - 3 reps - 30 sec hold - Supine Piriformis Stretch with Towel  - 2 x daily - 7 x weekly - 1 sets - 3 reps - 30 sec hold - Supine Lower Trunk Rotation  - 2 x daily - 7 x weekly - 1 sets - 3 reps - 30 sec hold - Standing Quadratus Lumborum Mobilization with Small Ball on Wall  - 2 x daily - 7 x weekly - 1 sets - 1 reps - 2-3 min hold - Supine Lower Trunk Rotation  - 2 x daily - 7 x weekly - 10 reps - 2 hold - Supine Posterior Pelvic Tilt  - 2 x daily - 7 x weekly - 2 sets - 10 reps - 2 hold   ASSESSMENT:  CLINICAL IMPRESSION: Pt needed VC for proper posture with TB exercises.  Demonstrated understanding.    OBJECTIVE IMPAIRMENTS: Abnormal gait, decreased mobility, difficulty walking, decreased strength, hypomobility, increased fascial restrictions, increased muscle spasms, impaired flexibility, postural dysfunction, and pain.    ACTIVITY LIMITATIONS: carrying, lifting, bending, sitting, standing, squatting, stairs, transfers, bed mobility, hygiene/grooming, and locomotion level  PARTICIPATION LIMITATIONS: meal prep, cleaning, laundry, driving, shopping, community activity, and yard work  PERSONAL FACTORS: Age, Behavior pattern, Past/current experiences, Time since onset of injury/illness/exacerbation, and 3+ comorbidities: Anemia, hx ankle surgery, Rt CTR are also affecting patient's functional outcome.   REHAB POTENTIAL: Good  CLINICAL DECISION MAKING: Evolving/moderate complexity  EVALUATION COMPLEXITY: Moderate   GOALS: Goals reviewed with patient? Yes  SHORT TERM GOALS: Target date: 09/26/2024  Independent with initial HEP Goal status: MET 09/25/24   LONG TERM GOALS: Target date: 10/17/2024  Independent with final HEP Goal status: INITIAL  2.  PSFS score improved by 2 points Goal status: INITIAL  3.  Bil LE strength improved to 4/5 for improved function and mobility Goal status: INIITAL  4.  Report pain < 5/10 with standing and walking for improved function Goal status: INITIAL  5.  5x STS improved to </= 20 sec for improved function and strength Goal status: INITIAL   PLAN:  PT FREQUENCY: 1-2x/week  PT DURATION: 6 weeks  PLANNED INTERVENTIONS: 97164- PT Re-evaluation, 97750- Physical Performance Testing, 97110-Therapeutic exercises, 97530- Therapeutic activity, 97112- Neuromuscular re-education, 97535- Self Care, [id number]- Manual therapy, [id number]- Aquatic Therapy, [id number]- Electrical stimulation (unattended), 97035- Ultrasound, 79439 (1-2 muscles), 20561 (3+ muscles)- Dry Needling, Patient/Family education, Balance training, Stair training, Taping, Joint mobilization, Joint manipulation, Spinal manipulation, Spinal mobilization, Cryotherapy, and Moist heat.  PLAN FOR NEXT SESSION: standing/seated core work,  continue light strengthening (hip/core) as able   NEXT MD VISIT: 10/21/24  Burnard Meth, PT 10/08/2024  10:55 AM

## 2024-10-08 ENCOUNTER — Ambulatory Visit: Payer: Self-pay

## 2024-10-08 DIAGNOSIS — M6281 Muscle weakness (generalized): Secondary | ICD-10-CM

## 2024-10-08 DIAGNOSIS — R29898 Other symptoms and signs involving the musculoskeletal system: Secondary | ICD-10-CM

## 2024-10-08 DIAGNOSIS — M5459 Other low back pain: Secondary | ICD-10-CM

## 2024-10-13 ENCOUNTER — Other Ambulatory Visit: Payer: Self-pay | Admitting: Physical Medicine and Rehabilitation

## 2024-10-14 ENCOUNTER — Other Ambulatory Visit: Payer: Self-pay | Admitting: Physical Medicine and Rehabilitation

## 2024-10-21 ENCOUNTER — Ambulatory Visit (INDEPENDENT_AMBULATORY_CARE_PROVIDER_SITE_OTHER): Admitting: Physical Medicine and Rehabilitation

## 2024-10-21 ENCOUNTER — Encounter: Payer: Self-pay | Admitting: Physical Medicine and Rehabilitation

## 2024-10-21 DIAGNOSIS — M5442 Lumbago with sciatica, left side: Secondary | ICD-10-CM | POA: Diagnosis not present

## 2024-10-21 DIAGNOSIS — M48061 Spinal stenosis, lumbar region without neurogenic claudication: Secondary | ICD-10-CM

## 2024-10-21 DIAGNOSIS — M5416 Radiculopathy, lumbar region: Secondary | ICD-10-CM | POA: Diagnosis not present

## 2024-10-21 DIAGNOSIS — G8929 Other chronic pain: Secondary | ICD-10-CM

## 2024-10-21 MED ORDER — TRAMADOL HCL 50 MG PO TABS: 50.0000 mg | ORAL_TABLET | Freq: Three times a day (TID) | ORAL | 0 refills | Status: AC | PRN

## 2024-10-21 NOTE — Progress Notes (Unsigned)
 Pain Scale   Average Pain 10 Patient advising PT made her lower back pain worse and now radiating to left leg.        +Driver, -BT, -Dye Allergies.

## 2024-10-21 NOTE — Progress Notes (Unsigned)
 "  Tina Valdez - 68 y.o. female MRN [id number]  Date of birth: Mar 12, 1956  Office Visit Note: Visit Date: 10/21/2024 PCP: Jordan, Betty G, MD Referred by: Jordan, Betty G, MD  Subjective: Chief Complaint  Patient presents with   Lower Back - Pain   HPI: Tina Valdez is a 68 y.o. female who comes in today for evaluation of chronic, worsening and severe bilateral lower back pain radiating to left buttock and down lateral leg to foot. Also reports intermittent tingling sensation to left leg. Pain ongoing for several years, worsened several years ago. Her pain is severe with movement and activity. Also reports pain with prolonged sitting. She describes pain as sharp and sore sensation, currently rates as 5 out of 10. Some relief of pain with home exercise regimen, rest and use of medications. She did attend several sessions of formal physical therapy with some relief of pain. Lumbar MRI imaging from February of this year shows degenerative changes to lower lumbar spine, there is left sided disc bulge at L5-S1 narrowing the lateral recess and could contact exiting left L5 nerve root. No high grade spinal canal stenosis. She underwent right S1 transforaminal epidural steroid injection on 03/25/2024. She reports complete relief of right sided pain with this injection, greater than 80% relief. Patient denies focal weakness. No recent trauma or falls.      Review of Systems  Musculoskeletal:  Positive for back pain.  Neurological:  Positive for tingling. Negative for sensory change, focal weakness and weakness.  All other systems reviewed and are negative.  Otherwise per HPI.  Assessment & Plan: Visit Diagnoses:    ICD-10-CM   1. Chronic bilateral low back pain with left-sided sciatica  G89.29 Ambulatory referral to Physical Medicine Rehab   M54.42     2. Lumbar radiculopathy  M54.16 Ambulatory referral to Physical Medicine Rehab    3. Stenosis of lateral recess of lumbar spine  M48.061         Plan: Findings:  Chronic, worsening and severe bilateral lower back pain radiating to left buttock and down lateral leg to foot. Patient continues to have severe pain despite good conservative therapies such as formal physical therapy, home exercise regimen, rest and use of medications. Patients clinical presentation and exam are consistent with lumbar radiculopathy, more of L5 nerve pattern. Prior lumbar MRI imaging does show lateral recess narrowing at L5-S1 on the left. We discussed treatment plan in detail today. Next step is to place order for diagnostic and hopefully therapeutic left L5 transforaminal epidural steroid injection under fluoroscopic guidance. If good relief of pain with injection we can repeat this procedure infrequently as needed. We discussed medication management, she can continue with Tylenol  500 mg TID, I also prescribed short course of Tramadol for her to take. We will see her back for injection procedure. No red flag symptoms noted upon exam today.     Meds & Orders:  Meds ordered this encounter  Medications   traMADol (ULTRAM) 50 MG tablet    Sig: Take 1 tablet (50 mg total) by mouth every 8 (eight) hours as needed.    Dispense:  20 tablet    Refill:  0    Orders Placed This Encounter  Procedures   Ambulatory referral to Physical Medicine Rehab    Follow-up: Return for Left L5 transforaminal epidural steroid injection.   Procedures: No procedures performed      Clinical History: MRI LUMBAR SPINE WITHOUT CONTRAST   TECHNIQUE: Multiplanar, multisequence MR imaging of  the lumbar spine was performed. No intravenous contrast was administered.   COMPARISON:  None Available.   FINDINGS: Segmentation:  Standard.   Alignment:  Physiologic.   Vertebrae: No fracture, evidence of discitis, or bone lesion. Degenerative endplate changes of the inferior and superior endplates of L3 on L4   Conus medullaris and cauda equina: Conus extends to the L2 level. Conus  and cauda equina appear normal.   Paraspinal and other soft tissues: Prominent nonspecific lymph nodes of the aortic bifurcation measuring up to 9 mm. These may be reactive.   Disc levels:   T12-L1: Mild left-sided facet degenerative change. No spinal canal narrowing. No neural foraminal narrowing   L1-L2: Mild bilateral facet degenerative change. No significant disc bulge. No spinal canal narrowing. Neural foraminal narrowing   L2-L3: Mild bilateral facet degenerative change. No significant disc bulge. Spinal canal narrowing. Neural foraminal narrowing   L3-L4: Mild-to-moderate left and mild right facet degenerative change. No significant disc bulge. No spinal canal narrowing. No significant neural foraminal narrowing.   L4-L5: Mild bilateral facet degenerative change. No significant disc bulge. Spinal canal narrowing. Mild right foraminal narrowing.   L5-S1: Moderate left and mild right neural foraminal narrowing. Eccentric left disc bulge. The extraforaminal component of the disc bulge may contact the exited left L5 nerve root no spinal canal narrowing. Mild bilateral neural foraminal narrowing. There is mild narrowing of the left lateral recess.   IMPRESSION: 1. No acute fracture or traumatic listhesis. 2. Lower lumbar spine predominant degenerative changes, most notably at L5-S1, where there is an eccentric left disc bulge that narrows the left lateral recess and may also contact the exited left L5 nerve root in the extraforaminal zone.     Electronically Signed   By: Lyndall Gore M.D.   On: 11/27/2023 06:58   She reports that she has never smoked. She has never used smokeless tobacco. No results for input(s): HGBA1C, LABURIC in the last 8760 hours.  Objective:  VS:  HT:    WT:   BMI:     BP:   HR: bpm  TEMP: ( )  RESP:  Physical Exam Vitals and nursing note reviewed.  HENT:     Head: Normocephalic and atraumatic.     Right Ear: External ear normal.      Left Ear: External ear normal.     Nose: Nose normal.     Mouth/Throat:     Mouth: Mucous membranes are moist.  Eyes:     Extraocular Movements: Extraocular movements intact.  Cardiovascular:     Rate and Rhythm: Normal rate.     Pulses: Normal pulses.  Pulmonary:     Effort: Pulmonary effort is normal.  Abdominal:     General: Abdomen is flat. There is no distension.  Musculoskeletal:        General: Tenderness present.     Cervical back: Normal range of motion.     Comments: Patient rises from seated position to standing without difficulty. Good lumbar range of motion. No pain noted with facet loading. 5/5 strength noted with bilateral hip flexion, knee flexion/extension, ankle dorsiflexion/plantarflexion and EHL. No clonus noted bilaterally. No pain upon palpation of greater trochanters. No pain with internal/external rotation of bilateral hips. Sensation intact bilaterally. Dysesthesias noted to left L5 dermatome. Positive slump test on the left. Ambulates without aid, gait steady.     Skin:    General: Skin is warm and dry.     Capillary Refill: Capillary refill takes less than  2 seconds.  Neurological:     General: No focal deficit present.     Mental Status: She is alert and oriented to person, place, and time.  Psychiatric:        Mood and Affect: Mood normal.        Behavior: Behavior normal.     Ortho Exam  Imaging: No results found.  Past Medical/Family/Surgical/Social History: Medications & Allergies reviewed per EMR, new medications updated. Patient Active Problem List   Diagnosis Date Noted   Carpal tunnel syndrome on right 04/28/2020   Postmenopausal bleeding 10/21/2015   Past Medical History:  Diagnosis Date   Anemia    vitamin D deficiency years ago)   Medical history non-contributory    Pneumonia    History reviewed. No pertinent family history. Past Surgical History:  Procedure Laterality Date   ANKLE SURGERY     BILATERAL SALPINGECTOMY  Bilateral 10/21/2015   Procedure: BILATERAL SALPINGECTOMY;  Surgeon: Delon Prude, DO;  Location: [facility];  Service: Gynecology;  Laterality: Bilateral;   CARPAL TUNNEL RELEASE     Left wrist   CARPAL TUNNEL RELEASE Right 04/29/2020   Procedure: RIGHT CARPAL TUNNEL RELEASE;  Surgeon: Jerri Kay HERO, MD;  Location: [facility];  Service: Orthopedics;  Laterality: Right;   CESAREAN SECTION     CHOLECYSTECTOMY     COLONOSCOPY     DILATION AND CURETTAGE OF UTERUS     LAPAROSCOPIC VAGINAL HYSTERECTOMY WITH SALPINGECTOMY Bilateral 10/21/2015   Procedure: LAPAROSCOPIC ASSISTED VAGINAL HYSTERECTOMY ;  Surgeon: Delon Prude, DO;  Location: [facility];  Service: Gynecology;  Laterality: Bilateral;   PILONIDAL CYST EXCISION     Social History   Occupational History   Not on file  Tobacco Use   Smoking status: Never   Smokeless tobacco: Never  Substance and Sexual Activity   Alcohol use: No   Drug use: No   Sexual activity: Yes    Birth control/protection: Post-menopausal   "

## 2024-11-07 ENCOUNTER — Other Ambulatory Visit: Payer: Self-pay

## 2024-11-07 ENCOUNTER — Ambulatory Visit: Admitting: Physical Medicine and Rehabilitation

## 2024-11-07 VITALS — BP 140/84 | HR 74

## 2024-11-07 DIAGNOSIS — M5416 Radiculopathy, lumbar region: Secondary | ICD-10-CM | POA: Diagnosis not present

## 2024-11-07 MED ORDER — METHYLPREDNISOLONE ACETATE 40 MG/ML IJ SUSP
40.0000 mg | Freq: Once | INTRAMUSCULAR | Status: AC
  Administered 2024-11-07: 40 mg

## 2024-11-18 NOTE — Procedures (Signed)
 Lumbosacral Transforaminal Epidural Steroid Injection - Sub-Pedicular Approach with Fluoroscopic Guidance  Patient: Tina Valdez      Date of Birth: 1956/03/05 MRN: [id number] PCP: Jordan, Betty G, MD      Visit Date: 11/07/2024   Universal Protocol:    Date/Time: 11/07/2024  Consent Given By: the patient  Position: PRONE  Additional Comments: Vital signs were monitored before and after the procedure. Patient was prepped and draped in the usual sterile fashion. The correct patient, procedure, and site was verified.   Injection Procedure Details:   Procedure diagnoses: Lumbar radiculopathy [M54.16]    Meds Administered:  Meds ordered this encounter  Medications   methylPREDNISolone  acetate (DEPO-MEDROL ) injection 40 mg    Laterality: Left  Location/Site: L5  Needle:6.0 in., 22 ga.  Short bevel or Quincke spinal needle  Needle Placement: Transforaminal  Findings:    -Comments: Excellent flow of contrast along the nerve, nerve root and into the epidural space.  Procedure Details: After squaring off the end-plates to get a true AP view, the C-arm was positioned so that an oblique view of the foramen as noted above was visualized. The target area is just inferior to the nose of the scotty dog or sub pedicular. The soft tissues overlying this structure were infiltrated with 2-3 ml. of 1% Lidocaine  without Epinephrine .  The spinal needle was inserted toward the target using a trajectory view along the fluoroscope beam.  Under AP and lateral visualization, the needle was advanced so it did not puncture dura and was located close the 6 O'Clock position of the pedical in AP tracterory. Biplanar projections were used to confirm position. Aspiration was confirmed to be negative for CSF and/or blood. A 1-2 ml. volume of Isovue-250 was injected and flow of contrast was noted at each level. Radiographs were obtained for documentation purposes.   After attaining the desired flow of  contrast documented above, a 0.5 to 1.0 ml test dose of 0.25% Marcaine  was injected into each respective transforaminal space.  The patient was observed for 90 seconds post injection.  After no sensory deficits were reported, and normal lower extremity motor function was noted,   the above injectate was administered so that equal amounts of the injectate were placed at each foramen (level) into the transforaminal epidural space.   Additional Comments:  The patient tolerated the procedure well Dressing: 2 x 2 sterile gauze and Band-Aid    Post-procedure details: Patient was observed during the procedure. Post-procedure instructions were reviewed.  Patient left the clinic in stable condition.

## 2024-11-18 NOTE — Progress Notes (Signed)
 "  Tina Valdez - 69 y.o. female MRN [id number]  Date of birth: [birth year]  Office Visit Note: Visit Date: 11/07/2024 PCP: Jordan, Betty G, MD Referred by: Jordan, Betty G, MD  Subjective: No chief complaint on file.  HPI:  Tina Valdez is a 69 y.o. female who comes in today at the request of Duwaine Pouch, FNP for planned Left L5-S1 Lumbar Transforaminal epidural steroid injection with fluoroscopic guidance.  The patient has failed conservative care including home exercise, medications, time and activity modification.  This injection will be diagnostic and hopefully therapeutic.  Please see requesting physician notes for further details and justification.   ROS Otherwise per HPI.  Assessment & Plan: Visit Diagnoses:    ICD-10-CM   1. Lumbar radiculopathy  M54.16 XR C-ARM NO REPORT    Epidural Steroid injection    methylPREDNISolone  acetate (DEPO-MEDROL ) injection 40 mg      Plan: No additional findings.   Meds & Orders:  Meds ordered this encounter  Medications   methylPREDNISolone  acetate (DEPO-MEDROL ) injection 40 mg    Orders Placed This Encounter  Procedures   XR C-ARM NO REPORT   Epidural Steroid injection    Follow-up: Return for visit to requesting provider as needed.   Procedures: No procedures performed  Lumbosacral Transforaminal Epidural Steroid Injection - Sub-Pedicular Approach with Fluoroscopic Guidance  Patient: Tina Valdez      Date of Birth: 05-Mar-1956 MRN: [id number] PCP: Jordan, Betty G, MD      Visit Date: 11/07/2024   Universal Protocol:    Date/Time: 11/07/2024  Consent Given By: the patient  Position: PRONE  Additional Comments: Vital signs were monitored before and after the procedure. Patient was prepped and draped in the usual sterile fashion. The correct patient, procedure, and site was verified.   Injection Procedure Details:   Procedure diagnoses: Lumbar radiculopathy [M54.16]    Meds Administered:  Meds ordered this  encounter  Medications   methylPREDNISolone  acetate (DEPO-MEDROL ) injection 40 mg    Laterality: Left  Location/Site: L5  Needle:6.0 in., 22 ga.  Short bevel or Quincke spinal needle  Needle Placement: Transforaminal  Findings:    -Comments: Excellent flow of contrast along the nerve, nerve root and into the epidural space.  Procedure Details: After squaring off the end-plates to get a true AP view, the C-arm was positioned so that an oblique view of the foramen as noted above was visualized. The target area is just inferior to the nose of the scotty dog or sub pedicular. The soft tissues overlying this structure were infiltrated with 2-3 ml. of 1% Lidocaine  without Epinephrine .  The spinal needle was inserted toward the target using a trajectory view along the fluoroscope beam.  Under AP and lateral visualization, the needle was advanced so it did not puncture dura and was located close the 6 O'Clock position of the pedical in AP tracterory. Biplanar projections were used to confirm position. Aspiration was confirmed to be negative for CSF and/or blood. A 1-2 ml. volume of Isovue-250 was injected and flow of contrast was noted at each level. Radiographs were obtained for documentation purposes.   After attaining the desired flow of contrast documented above, a 0.5 to 1.0 ml test dose of 0.25% Marcaine  was injected into each respective transforaminal space.  The patient was observed for 90 seconds post injection.  After no sensory deficits were reported, and normal lower extremity motor function was noted,   the above injectate was administered so that equal amounts of the injectate were  placed at each foramen (level) into the transforaminal epidural space.   Additional Comments:  The patient tolerated the procedure well Dressing: 2 x 2 sterile gauze and Band-Aid    Post-procedure details: Patient was observed during the procedure. Post-procedure instructions were  reviewed.  Patient left the clinic in stable condition.    Clinical History: MRI LUMBAR SPINE WITHOUT CONTRAST   TECHNIQUE: Multiplanar, multisequence MR imaging of the lumbar spine was performed. No intravenous contrast was administered.   COMPARISON:  None Available.   FINDINGS: Segmentation:  Standard.   Alignment:  Physiologic.   Vertebrae: No fracture, evidence of discitis, or bone lesion. Degenerative endplate changes of the inferior and superior endplates of L3 on L4   Conus medullaris and cauda equina: Conus extends to the L2 level. Conus and cauda equina appear normal.   Paraspinal and other soft tissues: Prominent nonspecific lymph nodes of the aortic bifurcation measuring up to 9 mm. These may be reactive.   Disc levels:   T12-L1: Mild left-sided facet degenerative change. No spinal canal narrowing. No neural foraminal narrowing   L1-L2: Mild bilateral facet degenerative change. No significant disc bulge. No spinal canal narrowing. Neural foraminal narrowing   L2-L3: Mild bilateral facet degenerative change. No significant disc bulge. Spinal canal narrowing. Neural foraminal narrowing   L3-L4: Mild-to-moderate left and mild right facet degenerative change. No significant disc bulge. No spinal canal narrowing. No significant neural foraminal narrowing.   L4-L5: Mild bilateral facet degenerative change. No significant disc bulge. Spinal canal narrowing. Mild right foraminal narrowing.   L5-S1: Moderate left and mild right neural foraminal narrowing. Eccentric left disc bulge. The extraforaminal component of the disc bulge may contact the exited left L5 nerve root no spinal canal narrowing. Mild bilateral neural foraminal narrowing. There is mild narrowing of the left lateral recess.   IMPRESSION: 1. No acute fracture or traumatic listhesis. 2. Lower lumbar spine predominant degenerative changes, most notably at L5-S1, where there is an eccentric left  disc bulge that narrows the left lateral recess and may also contact the exited left L5 nerve root in the extraforaminal zone.     Electronically Signed   By: Lyndall Gore M.D.   On: 11/27/2023 06:58     Objective:  VS:  HT:    WT:   BMI:     BP:(!) 140/84  HR:74bpm  TEMP: ( )  RESP:  Physical Exam Vitals and nursing note reviewed.  Constitutional:      General: She is not in acute distress.    Appearance: Normal appearance. She is obese. She is not ill-appearing.  HENT:     Head: Normocephalic and atraumatic.     Right Ear: External ear normal.     Left Ear: External ear normal.  Eyes:     Extraocular Movements: Extraocular movements intact.  Cardiovascular:     Rate and Rhythm: Normal rate.     Pulses: Normal pulses.  Pulmonary:     Effort: Pulmonary effort is normal. No respiratory distress.  Abdominal:     General: There is no distension.     Palpations: Abdomen is soft.  Musculoskeletal:        General: Tenderness present.     Cervical back: Neck supple.     Right lower leg: No edema.     Left lower leg: No edema.     Comments: Patient has good distal strength with no pain over the greater trochanters.  No clonus or focal weakness.  Skin:  Findings: No erythema, lesion or rash.  Neurological:     General: No focal deficit present.     Mental Status: She is alert and oriented to person, place, and time.     Sensory: No sensory deficit.     Motor: No weakness or abnormal muscle tone.     Coordination: Coordination normal.  Psychiatric:        Mood and Affect: Mood normal.        Behavior: Behavior normal.      Imaging: No results found. "

## 2024-12-06 ENCOUNTER — Ambulatory Visit: Admitting: Student in an Organized Health Care Education/Training Program
